# Patient Record
Sex: Female | Born: 1980 | Race: White | Hispanic: No | Marital: Married | Smoking: Never smoker
Health system: Southern US, Community
[De-identification: ages and names within clinical notes are randomized; demographics above are authoritative.]

## PROBLEM LIST (undated history)

## (undated) DIAGNOSIS — F32A Depression, unspecified: Secondary | ICD-10-CM

## (undated) DIAGNOSIS — Z3183 Encounter for assisted reproductive fertility procedure cycle: Secondary | ICD-10-CM

## (undated) DIAGNOSIS — G510 Bell's palsy: Secondary | ICD-10-CM

## (undated) DIAGNOSIS — N938 Other specified abnormal uterine and vaginal bleeding: Secondary | ICD-10-CM

## (undated) DIAGNOSIS — S93609A Unspecified sprain of unspecified foot, initial encounter: Secondary | ICD-10-CM

## (undated) DIAGNOSIS — R55 Syncope and collapse: Secondary | ICD-10-CM

## (undated) DIAGNOSIS — J309 Allergic rhinitis, unspecified: Secondary | ICD-10-CM

## (undated) DIAGNOSIS — F329 Major depressive disorder, single episode, unspecified: Secondary | ICD-10-CM

## (undated) DIAGNOSIS — G5600 Carpal tunnel syndrome, unspecified upper limb: Secondary | ICD-10-CM

## (undated) HISTORY — DX: Unspecified sprain of unspecified foot, initial encounter: S93.609A

## (undated) HISTORY — DX: Encounter for assisted reproductive fertility procedure cycle: Z31.83

## (undated) HISTORY — DX: Major depressive disorder, single episode, unspecified: F32.9

## (undated) HISTORY — DX: Allergic rhinitis, unspecified: J30.9

## (undated) HISTORY — DX: Bell's palsy: G51.0

## (undated) HISTORY — DX: Carpal tunnel syndrome, unspecified upper limb: G56.00

## (undated) HISTORY — DX: Syncope and collapse: R55

## (undated) HISTORY — DX: Depression, unspecified: F32.A

## (undated) HISTORY — DX: Other specified abnormal uterine and vaginal bleeding: N93.8

---

## 2009-05-16 HISTORY — PX: FACIAL RECONSTRUCTION SURGERY: SHX631

## 2009-08-04 ENCOUNTER — Emergency Department (HOSPITAL_BASED_OUTPATIENT_CLINIC_OR_DEPARTMENT_OTHER): Admission: EM | Admit: 2009-08-04 | Discharge: 2009-08-04 | Payer: Self-pay | Admitting: Emergency Medicine

## 2013-09-05 LAB — ROCKY MTN SPOTTED FVR AB, IGM-BLOOD: ROCKY MTN SPOTTED FEVER, IGM: 0.36

## 2014-01-29 LAB — LYME IGG/IGM AB
LYME IGM WB INTERP.: NEGATIVE
Lyme IgG/IgM Ab: <0.91

## 2014-01-29 LAB — URIC ACID: Uric Acid: 4.6

## 2014-01-29 LAB — RHEUMATOID FACTOR: Rheumatoid Factor Ab (IgM): <10

## 2014-01-29 LAB — ANTINUCLEAR ANTIBODIES, IFA: ANTINUCLEAR ANTIBODIES: NEGATIVE

## 2014-02-03 DIAGNOSIS — Z3183 Encounter for assisted reproductive fertility procedure cycle: Secondary | ICD-10-CM

## 2014-02-03 HISTORY — DX: Encounter for assisted reproductive fertility procedure cycle: Z31.83

## 2014-02-03 LAB — CBC AND DIFFERENTIAL
HCT: 40 % (ref 36–46)
HCT: 40 % (ref 36–46)
Hemoglobin: 13.1 g/dL (ref 12.0–16.0)
Hemoglobin: 13.1 g/dL (ref 12.0–16.0)
PLATELETS: 325 10*3/uL (ref 150–399)
Platelets: 325 10*3/uL (ref 150–399)
WBC: 8.7 10*3/mL
WBC: 8.7 10^3/mL

## 2014-02-03 LAB — SEDIMENTATION RATE: Sed Rate: 7

## 2014-03-06 DIAGNOSIS — J309 Allergic rhinitis, unspecified: Secondary | ICD-10-CM

## 2014-03-06 DIAGNOSIS — F419 Anxiety disorder, unspecified: Secondary | ICD-10-CM | POA: Insufficient documentation

## 2014-03-06 DIAGNOSIS — R5383 Other fatigue: Secondary | ICD-10-CM | POA: Insufficient documentation

## 2014-03-06 DIAGNOSIS — N938 Other specified abnormal uterine and vaginal bleeding: Secondary | ICD-10-CM

## 2014-03-06 DIAGNOSIS — M25579 Pain in unspecified ankle and joints of unspecified foot: Secondary | ICD-10-CM | POA: Insufficient documentation

## 2014-03-06 DIAGNOSIS — L858 Other specified epidermal thickening: Secondary | ICD-10-CM | POA: Insufficient documentation

## 2014-03-06 DIAGNOSIS — R251 Tremor, unspecified: Secondary | ICD-10-CM | POA: Insufficient documentation

## 2014-03-06 DIAGNOSIS — J329 Chronic sinusitis, unspecified: Secondary | ICD-10-CM | POA: Insufficient documentation

## 2014-03-06 DIAGNOSIS — F411 Generalized anxiety disorder: Secondary | ICD-10-CM | POA: Insufficient documentation

## 2014-03-06 DIAGNOSIS — F339 Major depressive disorder, recurrent, unspecified: Secondary | ICD-10-CM | POA: Insufficient documentation

## 2014-03-06 DIAGNOSIS — G510 Bell's palsy: Secondary | ICD-10-CM

## 2014-03-06 DIAGNOSIS — G5600 Carpal tunnel syndrome, unspecified upper limb: Secondary | ICD-10-CM

## 2014-03-06 DIAGNOSIS — D491 Neoplasm of unspecified behavior of respiratory system: Secondary | ICD-10-CM | POA: Insufficient documentation

## 2014-03-06 DIAGNOSIS — S93609A Unspecified sprain of unspecified foot, initial encounter: Secondary | ICD-10-CM

## 2014-03-06 HISTORY — DX: Unspecified sprain of unspecified foot, initial encounter: S93.609A

## 2014-03-06 HISTORY — DX: Carpal tunnel syndrome, unspecified upper limb: G56.00

## 2014-03-06 HISTORY — DX: Bell's palsy: G51.0

## 2014-03-06 HISTORY — DX: Allergic rhinitis, unspecified: J30.9

## 2014-03-06 HISTORY — DX: Other specified abnormal uterine and vaginal bleeding: N93.8

## 2014-12-22 DIAGNOSIS — R55 Syncope and collapse: Secondary | ICD-10-CM

## 2014-12-22 HISTORY — DX: Syncope and collapse: R55

## 2015-04-02 ENCOUNTER — Encounter: Payer: Self-pay | Admitting: Osteopathic Medicine

## 2015-04-02 ENCOUNTER — Ambulatory Visit (INDEPENDENT_AMBULATORY_CARE_PROVIDER_SITE_OTHER): Payer: Managed Care, Other (non HMO) | Admitting: Osteopathic Medicine

## 2015-04-02 VITALS — BP 125/67 | HR 69 | Ht 62.0 in | Wt 169.0 lb

## 2015-04-02 DIAGNOSIS — Z13220 Encounter for screening for lipoid disorders: Secondary | ICD-10-CM

## 2015-04-02 DIAGNOSIS — E669 Obesity, unspecified: Secondary | ICD-10-CM

## 2015-04-02 DIAGNOSIS — Z8249 Family history of ischemic heart disease and other diseases of the circulatory system: Secondary | ICD-10-CM | POA: Diagnosis not present

## 2015-04-02 DIAGNOSIS — N924 Excessive bleeding in the premenopausal period: Secondary | ICD-10-CM

## 2015-04-02 MED ORDER — PHENTERMINE HCL 15 MG PO CAPS: 15.0000 mg | ORAL_CAPSULE | ORAL | Status: DC

## 2015-04-02 MED ORDER — IBUPROFEN 800 MG PO TABS: 800.0000 mg | ORAL_TABLET | Freq: Three times a day (TID) | ORAL | Status: DC

## 2015-04-02 NOTE — Progress Notes (Signed)
HPI: Phyllis Glass is a 34 y.o. female who presents to [facility]  today for chief complaint of:  Chief Complaint  Patient presents with  . Establish Care    WEIGHT CONCERN   Patient here today with concern for obesity. She has been on phentermine in the past, she would like to restart this if possible since she is no longer breast-feeding. She has history of hypertension during pregnancies, never had to be on long-term medication for this. Reports last child was conceived via IVF, she has had her tubes tied in the past..  Also concerned for some heavy bleeding with her most recent period, bleeding has been irregular while breast-feeding, most recently was heavy for several days, was unable to leave the house was bleeding so much. No dizziness or loss of consciousness.   Past medical, social and family history reviewed: Past Medical History  Diagnosis Date  . 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 Allergic rhinitis 03/06/2014  . Bell palsy 03/06/2014  . Carpal tunnel syndrome 03/06/2014  . Foot sprain 03/06/2014  . DUB (dysfunctional uterine bleeding) 03/06/2014  . Encounter for assisted reproductive fertility procedure cycle 02/03/2014    Overview:  PRIOR IVF CYCLES:  0 PRIOR FET CYCLES:  0 PRIOR [place] CYCLES:  0  G 3 P  2 Term  2 Preterm  0 Biochemical  0 SAb  1 ECTOPIC  0   Packet Mailed: 1st packet given at NP appt 02/03/14; Protocol packet mailed 02/28/14  Diagnosis:  S/p BTL reversal 2013  Pharmacy:  [facility] ordered 02/28/14  Orientation Date:   02/17/14  Insurance Coverage: No  PAIRS:  offered  IVF Consents Signed: YES Date: 02/17/14  Deposit Paid: Yes  Date: 02/24/14  FEMALE TESTING  AMH: 3.52  Date: 02-03-14  Prolactin: 10.6 Date: 02-03-14  TSH: 3.46  Date: 02-03-14  IVF Labs: WNL   Date: 02-03-14  Group/Type: A+  Genetic Screening:WNL 02/03/14  PAP Smear:    Date:  Teaching  Appointment: Yes              Date: 04/08/14 @ 9  Uterine Cavity:  SIS WNL   Date:  06/2012  Mock ET and Measurements: yes Date:04/08/14  Prior ET:    Stylet Needed:  Dilation this cycle?  FEMALE TESTING  Semen Analysis: TMC: 57.8 Motility: 46 Date: 02-18-14  Female labs: WNL    Date: 02/03/14  PROTOCOL  Sperm Source:     Past Surgical History  Procedure Laterality Date  . Cesarean section      2005,2008,2016  . Facial reconstruction surgery  2011   Social History  Substance Use Topics  . Smoking status: Never Smoker   . Smokeless tobacco: Not on file  . Alcohol Use: Not on file   Family History  Problem Relation Age of Onset  . Hyperlipidemia Father   . Hypertension Father   . Alcohol abuse Maternal Grandfather   . Cancer Paternal Grandmother     BREAST  . Diabetes Paternal Grandmother   . Heart attack Father   . Heart attack Maternal Aunt   . Heart attack Maternal Uncle   . Heart attack Paternal Grandfather     No current outpatient prescriptions on file.   No current facility-administered medications for this visit.   No Known Allergies    Review of Systems: CONSTITUTIONAL:  No  fever, no chills, No  unintentional weight changes HEAD/EYES/EARS/NOSE/THROAT: No  headache, no vision change, no hearing change, No  sore throat CARDIAC: No chest pain, no pressure/palpitations, no orthopnea RESPIRATORY: No  cough, No  shortness of breath/wheeze GASTROINTESTINAL: No nausea, no vomiting, no abdominal pain, no blood in stool, no diarrhea, no constipation MUSCULOSKELETAL: No  myalgia/arthralgia GENITOURINARY: No incontinence, No abnormal genital bleeding/discharge SKIN: No rash/wounds/concerning lesions HEM/ONC: No easy bruising/bleeding, no abnormal lymph node ENDOCRINE: No polyuria/polydipsia/polyphagia, no heat/cold intolerance  NEUROLOGIC: No weakness, no dizziness, no slurred speech PSYCHIATRIC: No concerns with depression, no concerns with anxiety, no sleep problems     Exam:  BP 125/67 mmHg  Pulse 69  Ht 5\' 2"  (1.575 m)  Wt 169 lb (76.658 kg)  BMI 30.90 kg/m2 Constitutional: VSS, see above. General Appearance: alert, well-developed, well-nourished, NAD Eyes: Normal lids and conjunctive, non-icteric sclera, very slight facial droop on left side, consistent with history of persistent Bell's palsy Ears, Nose, Mouth, Throat: Normal external inspection ears/nares/mouth/lips/gums, MMM,  Neck: No masses, trachea midline. No thyroid enlargement/tenderness/mass appreciated. No lymphadenopathy Respiratory: Normal respiratory effort. no wheeze, no rhonchi, no rales Cardiovascular: S1/S2 normal, no murmur, no rub/gallop auscultated. RRR.  Gastrointestinal: Nontender, no masses. No hepatomegaly, no splenomegaly. No hernia appreciated. Bowel sounds normal. Rectal exam deferred.  Musculoskeletal: Gait normal. No clubbing/cyanosis of digits.  Neurological: No cranial nerve deficit on limited exam. Motor and sensation intact and symmetric Psychiatric: Normal judgment/insight. Normal mood and affect. Oriented x3.    No results found for this or any previous visit (from the past 72 hour(s)).    ASSESSMENT/PLAN:  Obesity - Plan: phentermine 15 MG capsule  Excessive bleeding in premenopausal period - Plan: ibuprofen (ADVIL,MOTRIN) 800 MG tablet  Lipid screening - Plan: COMPLETE METABOLIC PANEL WITH GFR, Lipid panel  Family history of heart attack - Plan: COMPLETE METABOLIC PANEL WITH GFR, Lipid panel   We'll start low-dose phentermine and follow blood pressure closely given history of PIH. Patient advised try ibuprofen for heavy bleeding, if bleeding continues to be an issue can consider hormonal contraception, however most likely irregularities are due to hormonal changes after discontinuing breast-feeding. Patient also requests cholesterol screening given family history of heart attacks at young age  Return in about 4 weeks (around 04/30/2015) for WEIGHT AND  BP CHECK ON PHENTERMINE.

## 2015-04-07 ENCOUNTER — Ambulatory Visit: Payer: Self-pay | Admitting: Osteopathic Medicine

## 2015-04-23 ENCOUNTER — Ambulatory Visit (INDEPENDENT_AMBULATORY_CARE_PROVIDER_SITE_OTHER): Payer: Managed Care, Other (non HMO) | Admitting: Osteopathic Medicine

## 2015-04-23 ENCOUNTER — Encounter: Payer: Self-pay | Admitting: Osteopathic Medicine

## 2015-04-23 VITALS — BP 123/78 | HR 68 | Ht 62.0 in | Wt 167.0 lb

## 2015-04-23 DIAGNOSIS — E669 Obesity, unspecified: Secondary | ICD-10-CM

## 2015-04-23 MED ORDER — PHENTERMINE HCL 30 MG PO CAPS: 30.0000 mg | ORAL_CAPSULE | ORAL | Status: DC

## 2015-04-23 NOTE — Progress Notes (Signed)
HPI: Phyllis Glass is a 34 y.o. female who presents to [facility] today for chief complaint of:  Chief Complaint  Patient presents with  . Follow-up    weight     Obesity Medication Management: Patient has been taking Phentermine 15 mg daily and has been unsuccessfully losing weight. Patient denies bothersome side effects of medication.   Blood pressure: see vital signs below. Patient denies chest pain, pressure, palpitations. No BP increase on meds.    Past medical, social and family history reviewed: Past Medical History  Diagnosis Date  . 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 Allergic rhinitis 03/06/2014  . Bell palsy 03/06/2014  . Carpal tunnel syndrome 03/06/2014  . Foot sprain 03/06/2014  . DUB (dysfunctional uterine bleeding) 03/06/2014  . Encounter for assisted reproductive fertility procedure cycle 02/03/2014    Overview:  PRIOR IVF CYCLES:  0 PRIOR FET CYCLES:  0 PRIOR [place] CYCLES:  0  G 3 P  2 Term  2 Preterm  0 Biochemical  0 SAb  1 ECTOPIC  0   Packet Mailed: 1st packet given at NP appt 02/03/14; Protocol packet mailed 02/28/14  Diagnosis:  S/p BTL reversal 2013  Pharmacy:  [facility] ordered 02/28/14  Orientation Date:   02/17/14  Insurance Coverage: No  PAIRS:  offered  IVF Consents Signed: YES Date: 02/17/14  Deposit Paid: Yes  Date: 02/24/14  FEMALE TESTING  AMH: 3.52  Date: 02-03-14  Prolactin: 10.6 Date: 02-03-14  TSH: 3.46  Date: 02-03-14  IVF Labs: WNL   Date: 02-03-14  Group/Type: A+  Genetic Screening:WNL 02/03/14  PAP Smear:    Date:  Teaching Appointment: Yes              Date: 04/08/14 @ 9  Uterine Cavity:  SIS WNL   Date:  06/2012  Mock ET and Measurements: yes Date:04/08/14  Prior ET:    Stylet Needed:  Dilation this cycle?  FEMALE TESTING  Semen Analysis: TMC: 57.8 Motility: 46 Date: 02-18-14  Female labs: WNL    Date: 02/03/14  PROTOCOL  Sperm Source:       Current Outpatient Prescriptions  Medication Sig Dispense Refill  . Glucosamine-Chondroit-Vit C-Mn (GLUCOSAMINE CHONDR 1500 COMPLX PO) Take 1,500 mg by mouth daily. TAKE 2 CAPSULES A DAY PER PATIENT    . ibuprofen (ADVIL,MOTRIN) 800 MG tablet Take 1 tablet (800 mg total) by mouth 3 (three) times daily. Start prior to period and continue for 5 days 30 tablet 1  . phentermine 15 MG capsule Take 1 capsule (30 mg total) by mouth every morning. 30 capsule 0   No current facility-administered medications for this visit.   No Known Allergies   Review of Systems: CONSTITUTIONAL:  No  intentional weight changes CARDIAC: No  chest pain, No  pressure, No palpitations, No  orthopnea RESPIRATORY: No  cough, No  shortness of breath/wheeze GASTROINTESTINAL: No  nausea, No  vomiting, No  abdominal pain, NEUROLOGIC: No  weakness, No  dizziness  Exam:  BP 123/78 mmHg  Pulse 68  Ht 5\' 2"  (1.575 m)  Wt 167 lb (75.751 kg)  BMI 30.54 kg/m2  2 lb weight loss since last visit Constitutional: VS see above. General Appearance: alert, well-developed, well-nourished, NAD Respiratory: Normal respiratory effort. no wheeze, no rhonchi, no rales Cardiovascular: S1/S2 normal, no murmur, no rub/gallop auscultated. RRR.  Gastrointestinal: Nontender, no masses. No hepatomegaly, no splenomegaly. No hernia appreciated. Bowel sounds  normal. Rectal exam deferred.  Psychiatric: Normal judgment/insight. Normal mood and affect. Oriented x3.   No results found for this or any previous visit (from the past 72 hour(s)).    ASSESSMENT/PLAN: Increase to Phentermine 30mg  daily and recheck 1 month.   Obesity - Plan: phentermine 30 MG capsule  Return in about 4 weeks (around 05/21/2015), or sooner if any concerns, for MEDICATION MANAGEMENT. Marland Kitchen

## 2015-04-24 LAB — COMPLETE METABOLIC PANEL WITH GFR
ALBUMIN: 4.2 g/dL (ref 3.6–5.1)
ALK PHOS: 44 U/L (ref 33–115)
ALT: 17 U/L (ref 6–29)
AST: 19 U/L (ref 10–30)
BILIRUBIN TOTAL: 0.3 mg/dL (ref 0.2–1.2)
BUN: 12 mg/dL (ref 7–25)
CO2: 28 mmol/L (ref 20–31)
CREATININE: 0.78 mg/dL (ref 0.50–1.10)
Calcium: 9.1 mg/dL (ref 8.6–10.2)
Chloride: 103 mmol/L (ref 98–110)
GFR, Est African American: >89 mL/min (ref >=60)
GLUCOSE: 91 mg/dL (ref 65–99)
Potassium: 4.6 mmol/L (ref 3.5–5.3)
SODIUM: 140 mmol/L (ref 135–146)
TOTAL PROTEIN: 6.8 g/dL (ref 6.1–8.1)

## 2015-04-24 LAB — LIPID PANEL
Cholesterol: 189 mg/dL (ref 125–200)
HDL: 49 mg/dL (ref >=46)
LDL CALC: 114 mg/dL (ref <130)
Total CHOL/HDL Ratio: 3.9 Ratio (ref <=5.0)
Triglycerides: 131 mg/dL (ref <150)
VLDL: 26 mg/dL (ref <30)

## 2015-05-22 ENCOUNTER — Ambulatory Visit: Payer: Managed Care, Other (non HMO) | Admitting: Osteopathic Medicine

## 2015-06-05 ENCOUNTER — Encounter: Payer: Self-pay | Admitting: Osteopathic Medicine

## 2015-06-09 ENCOUNTER — Encounter: Payer: Self-pay | Admitting: Osteopathic Medicine

## 2015-06-16 ENCOUNTER — Emergency Department (HOSPITAL_BASED_OUTPATIENT_CLINIC_OR_DEPARTMENT_OTHER)
Admission: EM | Admit: 2015-06-16 | Discharge: 2015-06-16 | Disposition: A | Discharge status: Home / Self Care | Payer: Managed Care, Other (non HMO) | Attending: Emergency Medicine | Admitting: Emergency Medicine

## 2015-06-16 ENCOUNTER — Encounter (HOSPITAL_BASED_OUTPATIENT_CLINIC_OR_DEPARTMENT_OTHER): Payer: Self-pay | Admitting: Emergency Medicine

## 2015-06-16 DIAGNOSIS — Z87828 Personal history of other (healed) physical injury and trauma: Secondary | ICD-10-CM | POA: Insufficient documentation

## 2015-06-16 DIAGNOSIS — T505X5A Adverse effect of appetite depressants, initial encounter: Secondary | ICD-10-CM | POA: Diagnosis not present

## 2015-06-16 DIAGNOSIS — Z8669 Personal history of other diseases of the nervous system and sense organs: Secondary | ICD-10-CM | POA: Insufficient documentation

## 2015-06-16 DIAGNOSIS — Z8742 Personal history of other diseases of the female genital tract: Secondary | ICD-10-CM | POA: Insufficient documentation

## 2015-06-16 DIAGNOSIS — R079 Chest pain, unspecified: Secondary | ICD-10-CM | POA: Diagnosis present

## 2015-06-16 DIAGNOSIS — R21 Rash and other nonspecific skin eruption: Secondary | ICD-10-CM | POA: Diagnosis not present

## 2015-06-16 DIAGNOSIS — R42 Dizziness and giddiness: Secondary | ICD-10-CM | POA: Insufficient documentation

## 2015-06-16 DIAGNOSIS — T50905A Adverse effect of unspecified drugs, medicaments and biological substances, initial encounter: Secondary | ICD-10-CM

## 2015-06-16 DIAGNOSIS — R531 Weakness: Secondary | ICD-10-CM | POA: Diagnosis not present

## 2015-06-16 DIAGNOSIS — Z79899 Other long term (current) drug therapy: Secondary | ICD-10-CM | POA: Diagnosis not present

## 2015-06-16 DIAGNOSIS — R0602 Shortness of breath: Secondary | ICD-10-CM | POA: Insufficient documentation

## 2015-06-16 LAB — COMPREHENSIVE METABOLIC PANEL
ALT: 16 U/L (ref 14–54)
AST: 22 U/L (ref 15–41)
Albumin: 4.5 g/dL (ref 3.5–5.0)
Alkaline Phosphatase: 50 U/L (ref 38–126)
Anion gap: 10 (ref 5–15)
BILIRUBIN TOTAL: 0.4 mg/dL (ref 0.3–1.2)
BUN: 10 mg/dL (ref 6–20)
CALCIUM: 9.3 mg/dL (ref 8.9–10.3)
CO2: 25 mmol/L (ref 22–32)
CREATININE: 0.71 mg/dL (ref 0.44–1.00)
Chloride: 104 mmol/L (ref 101–111)
GFR calc Af Amer: >60 mL/min (ref >60)
Glucose, Bld: 122 mg/dL — ABNORMAL HIGH (ref 65–99)
POTASSIUM: 3.7 mmol/L (ref 3.5–5.1)
Sodium: 139 mmol/L (ref 135–145)
TOTAL PROTEIN: 7.7 g/dL (ref 6.5–8.1)

## 2015-06-16 LAB — CBC WITH DIFFERENTIAL/PLATELET
BASOS PCT: 0 %
Basophils Absolute: 0 10*3/uL (ref 0.0–0.1)
EOS ABS: 0.2 10*3/uL (ref 0.0–0.7)
EOS PCT: 2 %
HCT: 41.6 % (ref 36.0–46.0)
HEMOGLOBIN: 13.5 g/dL (ref 12.0–15.0)
Lymphocytes Relative: 34 %
Lymphs Abs: 2.3 10*3/uL (ref 0.7–4.0)
MCH: 27.4 pg (ref 26.0–34.0)
MCHC: 32.5 g/dL (ref 30.0–36.0)
MCV: 84.4 fL (ref 78.0–100.0)
MONOS PCT: 8 %
Monocytes Absolute: 0.5 10*3/uL (ref 0.1–1.0)
NEUTROS PCT: 56 %
Neutro Abs: 3.8 10*3/uL (ref 1.7–7.7)
PLATELETS: 222 10*3/uL (ref 150–400)
RBC: 4.93 MIL/uL (ref 3.87–5.11)
RDW: 13.2 % (ref 11.5–15.5)
WBC: 6.8 10*3/uL (ref 4.0–10.5)

## 2015-06-16 LAB — TROPONIN I

## 2015-06-16 MED ORDER — DIPHENHYDRAMINE HCL 25 MG PO CAPS
25.0000 mg | ORAL_CAPSULE | Freq: Once | ORAL | Status: AC
  Administered 2015-06-16: 25 mg via ORAL
  Filled 2015-06-16: qty 1

## 2015-06-16 NOTE — ED Notes (Signed)
Pt in c/o chest pain and SOB onset today associated with weakness and dizziness and metallic taste in mouth. Started a new medicine last Thursday. EKG unremarkable.

## 2015-06-16 NOTE — ED Provider Notes (Signed)
CSN: [id number]     Arrival date & time 06/16/15  1956 History  By signing my name below, I, Hansel Feinstein, attest that this documentation has been prepared under the direction and in the presence of Veryl Speak, MD. Electronically Signed: Hansel Feinstein, ED Scribe. 06/16/2015. 9:07 PM.    Chief Complaint  Patient presents with  . Chest Pain   Patient is a 35 y.o. female presenting with chest pain. The history is provided by the patient. No language interpreter was used.  Chest Pain Pain location:  Substernal area Pain quality: aching   Pain radiates to:  Does not radiate Pain radiates to the back: no   Pain severity:  Moderate Onset quality:  Gradual Duration:  1 day Timing:  Constant Progression:  Unchanged Chronicity:  New Context: at rest   Relieved by:  Aspirin Worsened by:  Nothing tried Ineffective treatments:  None tried Associated symptoms: dizziness, shortness of breath and weakness   Associated symptoms: no dysphagia   Risk factors: no diabetes mellitus, no high cholesterol, no hypertension and no smoking     HPI Comments: Phyllis Glass is a 35 y.o. female neurocardiogenic syncope, bells palsy who presents to the Emergency Department complaining of moderate substernal CP with associated SOB, generalized weakness, dizziness, metallic taste. She also complains of intermittent generalized rashes for 5 days. She states she took ASA PTA with some relief of CP. Per pt, she has been taking Benadryl this week for the rash with some relief. Pt states she started phentermine 5 days ago (last dose this morning). Per pt, she was prescribed the medication by her PCP for weight loss. Per pt, she took this medication several years ago for 3 months without any side effects. She notes she was seen by her PCP today and her BP was 160/90 with a normal EKG. FHx of MI<55 yo. No h/o MI, DM, HTN, HLD. Pt is a non-smoker. Pt states she is active daily without difficulty, CP or SOB. Per pt, she had some  coffee today, but does not generally consume caffeine. She denies additional symptoms, difficulty swallowing.   Past Medical History  Diagnosis Date  . 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 Allergic rhinitis 03/06/2014  . Bell palsy 03/06/2014  . Carpal tunnel syndrome 03/06/2014  . Foot sprain 03/06/2014  . DUB (dysfunctional uterine bleeding) 03/06/2014  . Encounter for assisted reproductive fertility procedure cycle 02/03/2014    Overview:  PRIOR IVF CYCLES:  0 PRIOR FET CYCLES:  0 PRIOR [place] CYCLES:  0  G 3 P  2 Term  2 Preterm  0 Biochemical  0 SAb  1 ECTOPIC  0   Packet Mailed: 1st packet given at NP appt 02/03/14; Protocol packet mailed 02/28/14  Diagnosis:  S/p BTL reversal 2013  Pharmacy:  [facility] ordered 02/28/14  Orientation Date:   02/17/14  Insurance Coverage: No  PAIRS:  offered  IVF Consents Signed: YES Date: 02/17/14  Deposit Paid: Yes  Date: 02/24/14  FEMALE TESTING  AMH: 3.52  Date: 02-03-14  Prolactin: 10.6 Date: 02-03-14  TSH: 3.46  Date: 02-03-14  IVF Labs: WNL   Date: 02-03-14  Group/Type: A+  Genetic Screening:WNL 02/03/14  PAP Smear:    Date:  Teaching Appointment: Yes              Date: 04/08/14 @ 9  Uterine Cavity:  SIS WNL   Date:  06/2012  Mock ET and Measurements: yes  Date:04/08/14  Prior ET:    Stylet Needed:  Dilation this cycle?  FEMALE TESTING  Semen Analysis: TMC: 57.8 Motility: 46 Date: 02-18-14  Female labs: WNL    Date: 02/03/14  PROTOCOL  Sperm Source:     Past Surgical History  Procedure Laterality Date  . Cesarean section      2005,2008,2016  . Facial reconstruction surgery  2011    For attempted nerve damage repair Bell's palsy   Family History  Problem Relation Age of Onset  . Hyperlipidemia Father   . Hypertension Father   . Alcohol abuse Maternal Grandfather   . Cancer Paternal Grandmother     BREAST  . Diabetes Paternal Grandmother   . Heart attack Father   . Heart attack  Maternal Aunt   . Heart attack Maternal Uncle   . Heart attack Paternal Grandfather    Social History  Substance Use Topics  . Smoking status: Never Smoker   . Smokeless tobacco: None  . Alcohol Use: None   OB History    No data available     Review of Systems  HENT: Negative for trouble swallowing.   Respiratory: Positive for shortness of breath.   Cardiovascular: Positive for chest pain.  Skin: Positive for rash.  Neurological: Positive for dizziness and weakness.  All other systems reviewed and are negative.  Allergies  Review of patient's allergies indicates no known allergies.  Home Medications   Prior to Admission medications   Medication Sig Start Date End Date Taking? Authorizing Provider  Glucosamine-Chondroit-Vit C-Mn (GLUCOSAMINE CHONDR 1500 COMPLX PO) Take 1,500 mg by mouth daily. TAKE 2 CAPSULES A DAY PER PATIENT    Historical Provider, MD  ibuprofen (ADVIL,MOTRIN) 800 MG tablet Take 1 tablet (800 mg total) by mouth 3 (three) times daily. Start prior to period and continue for 5 days 04/02/15   Emeterio Reeve, DO  phentermine 30 MG capsule Take 1 capsule (30 mg total) by mouth every morning. 04/23/15   Natalie Alexander, DO   BP 138/95 mmHg  Pulse 75  Temp(Src) 97.8 F (36.6 C) (Oral)  Resp 16  Ht 5\' 2"  (1.575 m)  Wt 160 lb (72.576 kg)  BMI 29.26 kg/m2  SpO2 100%  LMP 05/16/2015 Physical Exam  Constitutional: She is oriented to person, place, and time. She appears well-developed and well-nourished. No distress.  HENT:  Head: Normocephalic and atraumatic.  Mouth/Throat: Oropharynx is clear and moist. No uvula swelling. No oropharyngeal exudate.  Eyes: Conjunctivae and EOM are normal. Pupils are equal, round, and reactive to light.  Neck: Normal range of motion.  Cardiovascular: Normal rate, regular rhythm and normal heart sounds.  Exam reveals no gallop and no friction rub.   No murmur heard. Pulmonary/Chest: Effort normal and breath sounds normal. No  respiratory distress. She has no wheezes. She has no rales.  Abdominal: Soft. She exhibits no distension and no mass. There is no tenderness. There is no rebound and no guarding.  Musculoskeletal: Normal range of motion.  Lymphadenopathy:    She has no cervical adenopathy.  Neurological: She is alert and oriented to person, place, and time.  Skin: Skin is warm and dry. Rash noted.  There is an urticarial-like rash to the chest and upper back.   Psychiatric: She has a normal mood and affect. Judgment normal.  Nursing note and vitals reviewed.   ED Course  Procedures (including critical care time) DIAGNOSTIC STUDIES: Oxygen Saturation is 100% on RA, normal by my interpretation.    COORDINATION  OF CARE: 9:03 PM Discussed treatment plan with pt at bedside and pt agreed to plan.   Labs Review Labs Reviewed  CBC WITH DIFFERENTIAL/PLATELET  COMPREHENSIVE METABOLIC PANEL  TROPONIN I    Imaging Review No results found. I have personally reviewed and evaluated these images and lab results as part of my medical decision-making.   EKG Interpretation   Date/Time:  Tuesday June 16 2015 20:04:36 EST Ventricular Rate:  78 PR Interval:  164 QRS Duration: 90 QT Interval:  360 QTC Calculation: 410 R Axis:   57 Text Interpretation:  Normal sinus rhythm Normal ECG Confirmed by Darshawn Boateng   MD, Percy Winterrowd ([id number]) on 06/16/2015 10:07:10 PM      MDM   Final diagnoses:  None    Workup reveals a normal EKG and negative troponin. Laboratory studies are unremarkable. Patient did have a blotchy rash to her chest and back which improved with Benadryl. I suspect the symptoms and rash are related to a reaction to this phentermine medication she is taking for weight loss. It is my advice that she stops taking this and talks to her doctor about alternate medications.  I personally performed the services described in this documentation, which was scribed in my presence. The recorded information has been  reviewed and is accurate.       Veryl Speak, MD 06/16/15 [phone]

## 2015-06-16 NOTE — Discharge Instructions (Signed)
Stop taking your phentermine until you speak with your primary physician.  Return to the ER if symptoms significantly worsen or change.

## 2015-07-16 ENCOUNTER — Telehealth: Payer: Self-pay | Admitting: Osteopathic Medicine

## 2015-07-16 ENCOUNTER — Other Ambulatory Visit: Payer: Self-pay | Admitting: Osteopathic Medicine

## 2015-07-16 NOTE — Telephone Encounter (Signed)
I called and spoke to patient to schedule a follow up appointment she stated she did not have her calendar available at that time and she would call back to schedule. - CF

## 2015-09-30 ENCOUNTER — Ambulatory Visit (INDEPENDENT_AMBULATORY_CARE_PROVIDER_SITE_OTHER): Payer: Managed Care, Other (non HMO) | Admitting: Osteopathic Medicine

## 2015-09-30 ENCOUNTER — Encounter: Payer: Self-pay | Admitting: Osteopathic Medicine

## 2015-09-30 VITALS — BP 129/81 | HR 96 | Ht 62.0 in | Wt 176.0 lb

## 2015-09-30 DIAGNOSIS — F32A Depression, unspecified: Secondary | ICD-10-CM

## 2015-09-30 DIAGNOSIS — F329 Major depressive disorder, single episode, unspecified: Secondary | ICD-10-CM | POA: Diagnosis not present

## 2015-09-30 MED ORDER — FLUOXETINE HCL (PMDD) 20 MG PO CAPS: 20.0000 mg | ORAL_CAPSULE | Freq: Every day | ORAL | Status: DC

## 2015-09-30 MED ORDER — FLUOXETINE HCL 40 MG PO CAPS: 40.0000 mg | ORAL_CAPSULE | Freq: Every day | ORAL | Status: DC

## 2015-09-30 NOTE — Progress Notes (Signed)
HPI: Phyllis Glass is a 35 y.o. female who presents to [facility] today for chief complaint of:  Chief Complaint  Patient presents with  . Follow-up    PROZAC    New problem to address today - DEPRESSION . Duration: on and off many years . Context: 3 years on Prozac after last pregnancy - was taking 40 mg and was doing well on this.  . Modifying factors: Had previously been on other antidepressants which didn't work very well. Zoloft, Lexapro. . Assoc signs/symptoms: weight gain, trouble controlling eating habits when depressed, no SI/HI   Past medical, social and family history reviewed: Past Medical History  Diagnosis Date  . 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 Allergic rhinitis 03/06/2014  . Bell palsy 03/06/2014  . Carpal tunnel syndrome 03/06/2014  . Foot sprain 03/06/2014  . DUB (dysfunctional uterine bleeding) 03/06/2014  . Encounter for assisted reproductive fertility procedure cycle 02/03/2014    Overview:  PRIOR IVF CYCLES:  0 PRIOR FET CYCLES:  0 PRIOR [place] CYCLES:  0  G 3 P  2 Term  2 Preterm  0 Biochemical  0 SAb  1 ECTOPIC  0   Packet Mailed: 1st packet given at NP appt 02/03/14; Protocol packet mailed 02/28/14  Diagnosis:  S/p BTL reversal 2013  Pharmacy:  [facility] ordered 02/28/14  Orientation Date:   02/17/14  Insurance Coverage: No  PAIRS:  offered  IVF Consents Signed: YES Date: 02/17/14  Deposit Paid: Yes  Date: 02/24/14  FEMALE TESTING  AMH: 3.52  Date: 02-03-14  Prolactin: 10.6 Date: 02-03-14  TSH: 3.46  Date: 02-03-14  IVF Labs: WNL   Date: 02-03-14  Group/Type: A+  Genetic Screening:WNL 02/03/14  PAP Smear:    Date:  Teaching Appointment: Yes              Date: 04/08/14 @ 9  Uterine Cavity:  SIS WNL   Date:  06/2012  Mock ET and Measurements: yes Date:04/08/14  Prior ET:    Stylet Needed:  Dilation this cycle?  FEMALE TESTING  Semen Analysis: TMC: 57.8  Motility: 46 Date: 02-18-14  Female labs: WNL    Date: 02/03/14  PROTOCOL  Sperm Source:     Past Surgical History  Procedure Laterality Date  . Cesarean section      2005,2008,2016  . Facial reconstruction surgery  2011    For attempted nerve damage repair Bell's palsy   Social History  Substance Use Topics  . Smoking status: Never Smoker   . Smokeless tobacco: Not on file  . Alcohol Use: Not on file   Family History  Problem Relation Age of Onset  . Hyperlipidemia Father   . Hypertension Father   . Alcohol abuse Maternal Grandfather   . Cancer Paternal Grandmother     BREAST  . Diabetes Paternal Grandmother   . Heart attack Father   . Heart attack Maternal Aunt   . Heart attack Maternal Uncle   . Heart attack Paternal Grandfather     Current Outpatient Prescriptions  Medication Sig Dispense Refill  . Glucosamine-Chondroit-Vit C-Mn (GLUCOSAMINE CHONDR 1500 COMPLX PO) Take 1,500 mg by mouth daily. TAKE 2 CAPSULES A DAY PER PATIENT     No current facility-administered medications for this visit.   No Known Allergies    Review of Systems: CONSTITUTIONAL:  No  fever, no chills, (+) weight gain unintentional weight changes HEAD/EYES/EARS/NOSE/THROAT: No  headache, no  vision change CARDIAC: No  chest pain PSYCHIATRIC: (+) concerns with depression, No  concerns with anxiety, No sleep problems  Exam:  BP 129/81 mmHg  Pulse 96  Ht 5\' 2"  (1.575 m)  Wt 176 lb (79.833 kg)  BMI 32.18 kg/m2 Constitutional: VS see above. General Appearance: alert, well-developed, well-nourished, NAD Eyes: Normal lids and conjunctive, non-icteric sclera,  Ears, Nose, Mouth, Throat: MMM, Normal external inspection ears/nares/mouth/lips/gums,  Respiratory: Normal respiratory effort.  Musculoskeletal: Gait normal.  Psychiatric: Normal judgment/insight. Normal mood and affect. Oriented x3. No thought disorder, no SI/HI   No results found for this or any previous visit (from the past 36  hour(s)).    ASSESSMENT/PLAN:  Depression - Plan: FLUoxetine (PROZAC) 40 MG capsule, Fluoxetine HCl, PMDD, 20 MG CAPS   20 mg x 2 weeks then increase to 40 mg. I don't See 40 mg tablets listed on Epic as a choice for prescription, so to prescriptions with appropriate start/stop dates were written for the patient, she is encouraged to ask pharmacist if comes and 40 mg tablets which he can cut, I'm happy to do this for her. Follow-up in 6 months, sooner if needed or if any concerns.  All questions were answered. Visit summary with updated medication list and pertinent instructions was printed for patient. ER/RTC precautions were reviewed with the patient. Return in about 6 months (around 04/01/2016), or sooner if needed, for MEDICATION MANAGEMENT.

## 2015-09-30 NOTE — Patient Instructions (Signed)
Prozac - 20 mg x 2 weeks then up to 40 mg

## 2016-02-10 ENCOUNTER — Other Ambulatory Visit: Payer: Self-pay | Admitting: Osteopathic Medicine

## 2016-02-10 DIAGNOSIS — F329 Major depressive disorder, single episode, unspecified: Secondary | ICD-10-CM

## 2016-02-10 DIAGNOSIS — F32A Depression, unspecified: Secondary | ICD-10-CM

## 2016-03-14 ENCOUNTER — Other Ambulatory Visit: Payer: Self-pay | Admitting: Physician Assistant

## 2016-03-14 DIAGNOSIS — F329 Major depressive disorder, single episode, unspecified: Secondary | ICD-10-CM

## 2016-03-14 DIAGNOSIS — F32A Depression, unspecified: Secondary | ICD-10-CM

## 2016-05-26 ENCOUNTER — Other Ambulatory Visit: Payer: Self-pay | Admitting: Osteopathic Medicine

## 2016-05-26 DIAGNOSIS — F329 Major depressive disorder, single episode, unspecified: Secondary | ICD-10-CM

## 2016-05-26 DIAGNOSIS — F32A Depression, unspecified: Secondary | ICD-10-CM

## 2016-07-02 ENCOUNTER — Other Ambulatory Visit: Payer: Self-pay | Admitting: Osteopathic Medicine

## 2016-07-02 DIAGNOSIS — F329 Major depressive disorder, single episode, unspecified: Secondary | ICD-10-CM

## 2016-07-02 DIAGNOSIS — F32A Depression, unspecified: Secondary | ICD-10-CM

## 2016-07-11 ENCOUNTER — Other Ambulatory Visit: Payer: Self-pay

## 2016-07-11 DIAGNOSIS — F3289 Other specified depressive episodes: Secondary | ICD-10-CM

## 2016-07-11 MED ORDER — FLUOXETINE HCL 40 MG PO CAPS: 40.0000 mg | ORAL_CAPSULE | Freq: Every day | ORAL | 0 refills | Status: DC

## 2016-07-11 NOTE — Telephone Encounter (Signed)
Patient called requested a refill for Floxetine 40 mg. Patient was advised on last refill that she needed a follow up appointment and she did not schedule before she ran out.  I refilled RX 1 MORE TIME FOR PATIENT BECAUSE SHE STATED THAT SHE DIDN'T WANT TO COME IN THE OFFICE DURING FLU SEASON BECAUSE SHE DID NOT WANT TO CATCH ANY GERMS AND TAKE HOME TOO HER 5 KIDS! PATIENT VERBALLY UNDERSTOOD THAT SHE MUST SCHEDULE A FOLLOW UP BEFORE BEFORE SHE RUNS OUT OR HER REFILL REQUEST WILL BE DENIED. PLEASE ADVISE. Rhonda Cunningham,CMA

## 2016-08-11 ENCOUNTER — Encounter: Payer: Self-pay | Admitting: Osteopathic Medicine

## 2016-08-11 ENCOUNTER — Ambulatory Visit (INDEPENDENT_AMBULATORY_CARE_PROVIDER_SITE_OTHER): Payer: Managed Care, Other (non HMO) | Admitting: Osteopathic Medicine

## 2016-08-11 DIAGNOSIS — F3341 Major depressive disorder, recurrent, in partial remission: Secondary | ICD-10-CM | POA: Diagnosis not present

## 2016-08-11 DIAGNOSIS — F3289 Other specified depressive episodes: Secondary | ICD-10-CM | POA: Diagnosis not present

## 2016-08-11 MED ORDER — FLUOXETINE HCL 40 MG PO CAPS: 40.0000 mg | ORAL_CAPSULE | Freq: Every day | ORAL | 3 refills | Status: DC

## 2016-08-11 NOTE — Progress Notes (Signed)
HPI: Phyllis Glass is a 36 y.o. female who presents to [place] today for chief complaint of:  Chief Complaint  Patient presents with  . Follow-up    DEPRESSION    Follow up on DEPRESSION - last seen on 09/30/15 . Duration: on and off many years . Context: 3 years on Prozac after last pregnancy - was taking 40 mg and was doing well on this. Prozac started at last visit with instructions to follow-up 03/2016 . Note: Had previously been on other antidepressants which didn't work very well. Zoloft, Lexapro, Effexor. . Depression-assoc signs/symptoms at last viist: weight gain, trouble controlling eating habits when depressed, no SI/HI. Current status: weight gain since last visit. Moods are much improved.     Past medical, social and family history reviewed: Past Medical History:  Diagnosis Date  . Allergic rhinitis 03/06/2014  . Bell palsy 03/06/2014  . Carpal tunnel syndrome 03/06/2014  . DUB (dysfunctional uterine bleeding) 03/06/2014  . Encounter for assisted reproductive fertility procedure cycle 02/03/2014   Overview:  PRIOR IVF CYCLES:  0 PRIOR FET CYCLES:  0 PRIOR [place] CYCLES:  0  G 3 P  2 Term  2 Preterm  0 Biochemical  0 SAb  1 ECTOPIC  0   Packet Mailed: 1st packet given at NP appt 02/03/14; Protocol packet mailed 02/28/14  Diagnosis:  S/p BTL reversal 2013  Pharmacy:  [facility] ordered 02/28/14  Orientation Date:   02/17/14  Insurance Coverage: No  PAIRS:  offered  IVF Consents Signed: YES Date: 02/17/14  Deposit Paid: Yes  Date: 02/24/14  FEMALE TESTING  AMH: 3.52  Date: 02-03-14  Prolactin: 10.6 Date: 02-03-14  TSH: 3.46  Date: 02-03-14  IVF Labs: WNL   Date: 02-03-14  Group/Type: A+  Genetic Screening:WNL 02/03/14  PAP Smear:    Date:  Teaching Appointment: Yes              Date: 04/08/14 @ 9  Uterine Cavity:  SIS WNL   Date:  06/2012  Mock ET and Measurements: yes Date:04/08/14  Prior ET:    Stylet Needed:  Dilation this  cycle?  FEMALE TESTING  Semen Analysis: TMC: 57.8 Motility: 46 Date: 02-18-14  Female labs: WNL    Date: 02/03/14  PROTOCOL  Sperm Source:    . Foot sprain 03/06/2014  . Neurocardiogenic syncope 12/22/2014   Overview:  DURING MEDICAL PROCEDURES PER PATIENT REPORT    Past Surgical History:  Procedure Laterality Date  . CESAREAN SECTION     [phone]  . FACIAL RECONSTRUCTION SURGERY  2011   For attempted nerve damage repair Bell's palsy   Social History  Substance Use Topics  . Smoking status: Never Smoker  . Smokeless tobacco: Not on file  . Alcohol use Not on file   Family History  Problem Relation Age of Onset  . Hyperlipidemia Father   . Hypertension Father   . Alcohol abuse Maternal Grandfather   . Cancer Paternal Grandmother     BREAST  . Diabetes Paternal Grandmother   . Heart attack Father   . Heart attack Maternal Aunt   . Heart attack Maternal Uncle   . Heart attack Paternal Grandfather     Current Outpatient Prescriptions  Medication Sig Dispense Refill  . FLUoxetine (PROZAC) 40 MG capsule Take 1 capsule (40 mg total) by mouth daily. NEED FOLLOW UP APPOINTMENT FOR MORE REFILLS 30 capsule 0  . Fluoxetine HCl, PMDD, 20 MG CAPS Take 1 capsule (20 mg total)  by mouth daily. 14 each 0  . Glucosamine-Chondroit-Vit C-Mn (GLUCOSAMINE CHONDR 1500 COMPLX PO) Take 1,500 mg by mouth daily. TAKE 2 CAPSULES A DAY PER PATIENT     No current facility-administered medications for this visit.    No Known Allergies    Review of Systems: CONSTITUTIONAL:  No  fever, no chills, (+) weight gain unintentional weight changes HEAD/EYES/EARS/NOSE/THROAT: No  headache, no vision change CARDIAC: No  chest pain PSYCHIATRIC: (-) concerns with depression, No  concerns with anxiety, No sleep problems  Exam:  BP 136/83   Pulse 82   Ht 5\' 2"  (1.575 m)   Wt 186 lb (84.4 kg)   BMI 34.02 kg/m  Constitutional: VS see above. General Appearance: alert, well-developed, well-nourished, NAD Eyes:  Normal lids and conjunctive, non-icteric sclera,  Ears, Nose, Mouth, Throat: MMM, Normal external inspection ears/nares/mouth/lips/gums,  Respiratory: Normal respiratory effort.  Musculoskeletal: Gait normal.  Psychiatric: Normal judgment/insight. Normal mood and affect. Oriented x3. No thought disorder, no SI/HI  No flowsheet data found.    ASSESSMENT/PLAN: OK to continue Fluoxetine current dose, pt doing well.   Recurrent major depressive disorder, in partial remission (HCC)  Other depression - Plan: FLUoxetine (PROZAC) 40 MG capsule     All questions were answered. Visit summary with updated medication list and pertinent instructions was printed for patient. ER/RTC precautions were reviewed with the patient. Return for [place] sometime in the next 6 months or so, sooner if needed .

## 2017-03-03 ENCOUNTER — Encounter: Payer: Self-pay | Admitting: Physician Assistant

## 2017-03-03 ENCOUNTER — Ambulatory Visit (INDEPENDENT_AMBULATORY_CARE_PROVIDER_SITE_OTHER): Payer: Managed Care, Other (non HMO) | Admitting: Physician Assistant

## 2017-03-03 VITALS — BP 123/83 | HR 78 | Temp 98.2°F | Wt 177.0 lb

## 2017-03-03 DIAGNOSIS — G518 Other disorders of facial nerve: Secondary | ICD-10-CM | POA: Diagnosis not present

## 2017-03-03 DIAGNOSIS — G510 Bell's palsy: Secondary | ICD-10-CM | POA: Diagnosis not present

## 2017-03-03 MED ORDER — HYPROMELLOSE (GONIOSCOPIC) 2.5 % OP SOLN: 1.0000 [drp] | Freq: Three times a day (TID) | OPHTHALMIC | 0 refills | Status: DC | PRN

## 2017-03-03 MED ORDER — CYCLOBENZAPRINE HCL 5 MG PO TABS: 5.0000 mg | ORAL_TABLET | Freq: Three times a day (TID) | ORAL | 0 refills | Status: DC | PRN

## 2017-03-03 MED ORDER — TRAMADOL HCL 50 MG PO TABS: 50.0000 mg | ORAL_TABLET | Freq: Three times a day (TID) | ORAL | 0 refills | Status: DC | PRN

## 2017-03-03 MED ORDER — VALACYCLOVIR HCL 1 G PO TABS: 1000.0000 mg | ORAL_TABLET | Freq: Three times a day (TID) | ORAL | 0 refills | Status: AC

## 2017-03-03 MED ORDER — PREDNISONE 10 MG (48) PO TBPK: ORAL_TABLET | Freq: Every day | ORAL | 0 refills | Status: DC

## 2017-03-03 NOTE — Patient Instructions (Addendum)
Bell Palsy, Adult Bell palsy is a short-term inability to move muscles in part of the face. The inability to move (paralysis) results from inflammation or compression of the facial nerve, which travels along the skull and under the ear to the side of the face (7th cranial nerve). This nerve is responsible for facial movements that include blinking, closing the eyes, smiling, and frowning. What are the causes? The exact cause of this condition is not known. It may be caused by an infection from a virus, such as the chickenpox (herpes zoster), Epstein-Barr, or mumps virus. What increases the risk? You are more likely to develop this condition if:  You are pregnant.  You have diabetes.  You have had a recent infection in your nose, throat, or airways (upper respiratory infection).  You have a weakened body defense system (immune system).  You have had a facial injury, such as a fracture.  You have a family history of Bell palsy.  What are the signs or symptoms? Symptoms of this condition include:  Weakness on one side of the face.  Drooping eyelid and corner of the mouth.  Excessive tearing in one eye.  Difficulty closing the eyelid.  Dry eye.  Drooling.  Dry mouth.  Changes in taste.  Change in facial appearance.  Pain behind one ear.  Ringing in one or both ears.  Sensitivity to sound in one ear.  Facial twitching.  Headache.  Impaired speech.  Dizziness.  Difficulty eating or drinking.  Most of the time, only one side of the face is affected. Rarely, Bell palsy affects the whole face. How is this diagnosed? This condition is diagnosed based on:  Your symptoms.  Your medical history.  A physical exam.  You may also have to see health care providers who specialize in disorders of the nerves (neurologist) or diseases and conditions of the eye (ophthalmologist). You may have tests, such as:  A test to check for nerve damage (electromyogram).  Imaging  studies, such as CT or MRI scans.  Blood tests.  How is this treated? This condition affects every person differently. Sometimes symptoms go away without treatment within a couple weeks. If treatment is needed, it varies from person to person. The goal of treatment is to reduce inflammation and protect the eye from damage. Treatment for Bell palsy may include:  Medicines, such as: ? Steroids to reduce swelling and inflammation. ? Antiviral drugs. ? Pain relievers, including aspirin, acetaminophen, or ibuprofen.  Eye drops or ointment to keep your eye moist.  Eye protection, if you cannot close your eye.  Exercises or massage to regain muscle strength and function (physical therapy).  Follow these instructions at home:  Take over-the-counter and prescription medicines only as told by your health care provider.  If your eye is affected: ? Keep your eye moist with eye drops or ointment as told by your health care provider. ? Follow instructions for eye care and protection as told by your health care provider.  Do any physical therapy exercises as told by your health care provider.  Keep all follow-up visits as told by your health care provider. This is important. Contact a health care provider if:  You have a fever.  Your symptoms do not get better within 2-3 weeks, or your symptoms get worse.  Your eye is red, irritated, or painful.  You have new symptoms. Get help right away if:  You have weakness or numbness in a part of your body other than your face.    You have trouble swallowing.  You develop neck pain or stiffness.  You develop dizziness or shortness of breath. Summary  Bell palsy is a short-term inability to move muscles in part of the face. The inability to move (paralysis) results from inflammation or compression of the facial nerve.  This condition affects every person differently. Sometimes symptoms go away without treatment within a couple weeks.  If  treatment is needed, it varies from person to person. The goal of treatment is to reduce inflammation and protect the eye from damage.  Contact your health care provider if your symptoms do not get better within 2-3 weeks, or your symptoms get worse. This information is not intended to replace advice given to you by your health care provider. Make sure you discuss any questions you have with your health care provider. Document Released: 05/02/2005 Document Revised: 07/05/2016 Document Reviewed: 07/05/2016 Elsevier Interactive Patient Education  2018 [employer]  

## 2017-03-03 NOTE — Progress Notes (Signed)
HPI:                                                                Phyllis Glass is a 36 y.o. female who presents to [place]: [facility] today for facial weakness  Patient presents today with sudden onset left-sided facial weakness. Reports it began in the last two hours with the lower face/mouth and has gradually spread to her left eye and brow. She endorses some left sided tinnitus and hearing loss. Also endorses pain in her left cheek and temporal area. Denies vision change, dysphagia, slurred speech, headache, upper/lower extremity weakness, or gait disturbance. She reports history of Bell's palsy x 2 during her pregnancies. Last episode was approximately 10 years ago. She states she had residual neurologic complications and had to have facial re-animation surgery, which failed.  Past Medical History:  Diagnosis Date  . Allergic rhinitis 03/06/2014  . Bell palsy 03/06/2014  . Carpal tunnel syndrome 03/06/2014  . DUB (dysfunctional uterine bleeding) 03/06/2014  . Encounter for assisted reproductive fertility procedure cycle 02/03/2014   Overview:  PRIOR IVF CYCLES:  0 PRIOR FET CYCLES:  0 PRIOR [place] CYCLES:  0  G 3 P  2 Term  2 Preterm  0 Biochemical  0 SAb  1 ECTOPIC  0   Packet Mailed: 1st packet given at NP appt 02/03/14; Protocol packet mailed 02/28/14  Diagnosis:  S/p BTL reversal 2013  Pharmacy:  [facility] ordered 02/28/14  Orientation Date:   02/17/14  Insurance Coverage: No  PAIRS:  offered  IVF Consents Signed: YES Date: 02/17/14  Deposit Paid: Yes  Date: 02/24/14  FEMALE TESTING  AMH: 3.52  Date: 02-03-14  Prolactin: 10.6 Date: 02-03-14  TSH: 3.46  Date: 02-03-14  IVF Labs: WNL   Date: 02-03-14  Group/Type: A+  Genetic Screening:WNL 02/03/14  PAP Smear:    Date:  Teaching Appointment: Yes              Date: 04/08/14 @ 9  Uterine Cavity:  SIS WNL   Date:  06/2012  Mock ET and Measurements: yes Date:04/08/14  Prior ET:    Stylet  Needed:  Dilation this cycle?  FEMALE TESTING  Semen Analysis: TMC: 57.8 Motility: 46 Date: 02-18-14  Female labs: WNL    Date: 02/03/14  PROTOCOL  Sperm Source:    . Foot sprain 03/06/2014  . Neurocardiogenic syncope 12/22/2014   Overview:  DURING MEDICAL PROCEDURES PER PATIENT REPORT    Past Surgical History:  Procedure Laterality Date  . CESAREAN SECTION     [phone]  . FACIAL RECONSTRUCTION SURGERY  2011   For attempted nerve damage repair Bell's palsy   Social History  Substance Use Topics  . Smoking status: Never Smoker  . Smokeless tobacco: Never Used  . Alcohol use Not on file   family history includes Alcohol abuse in her maternal grandfather; Cancer in her paternal grandmother; Diabetes in her paternal grandmother; Heart attack in her father, maternal aunt, maternal uncle, and paternal grandfather; Hyperlipidemia in her father; Hypertension in her father.  ROS: negative except as noted in the HPI  Medications: Current Outpatient Prescriptions  Medication Sig Dispense Refill  . FLUoxetine (PROZAC) 40 MG capsule Take 1 capsule (40 mg total) by mouth daily.  90 capsule 3   No current facility-administered medications for this visit.    No Known Allergies     Objective:  BP 123/83   Pulse 78   Temp 98.2 F (36.8 C) (Oral)   Wt 177 lb (80.3 kg)   BMI 32.37 kg/m  Gen: well-groomed, not ill-appearing, no acute distress HEENT: head normocephalic, atraumatic; conjunctiva and cornea clear, left eye watering, left external canal obstructed by cerumen, no rash in the canal Pulm: Normal work of breathing Neuro:  Cranial nerve 7 palsy, PERRLA, no nystagmus, normal coordination, normal tone, no tremor MSK: strength 5/5 and symmetric in bilateral upper and lower extremities, normal gait and station Mental Status: alert and oriented x 3, speech articulate, and thought processes clear and goal-directed    Depression screen PHQ 2/9 08/11/2016  Altered sleeping 3  Tired,  decreased energy 3  Change in appetite 3  Feeling bad or failure about yourself  1  Trouble concentrating 1  Moving slowly or fidgety/restless 0  Suicidal thoughts 0     No results found for this or any previous visit (from the past 72 hour(s)). No results found.    Assessment and Plan: 35 y.o. female with   1. Facial nerve palsy - recurrent. Antivirals x 7 days, steroids x 12 days, tramadol for pain, artificial tears - placed referral to neurology given patient's history of residual complications - predniSONE (STERAPRED UNI-PAK 48 TAB) 10 MG (48) TBPK tablet; Take by mouth daily. Use as directed for taper  Dispense: 1 tablet; Refill: 0 - valACYclovir (VALTREX) 1000 MG tablet; Take 1 tablet (1,000 mg total) by mouth 3 (three) times daily.  Dispense: 21 tablet; Refill: 0 - Ambulatory referral to Neurology - hydroxypropyl methylcellulose / hypromellose (ISOPTO TEARS / GONIOVISC) 2.5 % ophthalmic solution; Place 1 drop into the left eye 3 (three) times daily as needed for dry eyes.  Dispense: 15 mL; Refill: 0  2. Neuralgic facial pain - traMADol (ULTRAM) 50 MG tablet; Take 1 tablet (50 mg total) by mouth every 8 (eight) hours as needed.  Dispense: 30 tablet; Refill: 0 - cyclobenzaprine (FLEXERIL) 5 MG tablet; Take 1 tablet (5 mg total) by mouth 3 (three) times daily as needed for muscle spasms.  Dispense: 30 tablet; Refill: 0  Patient education and anticipatory guidance given Patient agrees with treatment plan Follow-up in 2 weeks with PCP or sooner as needed if symptoms worsen or fail to improve  Darlyne Russian PA-C

## 2017-03-17 ENCOUNTER — Ambulatory Visit: Payer: Managed Care, Other (non HMO) | Admitting: Osteopathic Medicine

## 2017-08-04 ENCOUNTER — Encounter: Payer: Self-pay | Admitting: Family Medicine

## 2017-08-04 ENCOUNTER — Ambulatory Visit: Payer: Managed Care, Other (non HMO) | Admitting: Family Medicine

## 2017-08-04 VITALS — BP 138/83 | HR 77 | Ht 62.0 in | Wt 184.0 lb

## 2017-08-04 DIAGNOSIS — H60502 Unspecified acute noninfective otitis externa, left ear: Secondary | ICD-10-CM | POA: Diagnosis not present

## 2017-08-04 DIAGNOSIS — H6122 Impacted cerumen, left ear: Secondary | ICD-10-CM

## 2017-08-04 DIAGNOSIS — H6981 Other specified disorders of Eustachian tube, right ear: Secondary | ICD-10-CM

## 2017-08-04 DIAGNOSIS — G510 Bell's palsy: Secondary | ICD-10-CM | POA: Diagnosis not present

## 2017-08-04 MED ORDER — NEOMYCIN-POLYMYXIN-HC 3.5-10000-1 OT SOLN: 4.0000 [drp] | Freq: Four times a day (QID) | OTIC | 0 refills | Status: DC

## 2017-08-04 MED ORDER — CEFDINIR 300 MG PO CAPS: 300.0000 mg | ORAL_CAPSULE | Freq: Two times a day (BID) | ORAL | 0 refills | Status: DC

## 2017-08-04 MED ORDER — PREDNISONE 10 MG PO TABS: 30.0000 mg | ORAL_TABLET | Freq: Every day | ORAL | 0 refills | Status: DC

## 2017-08-04 NOTE — Progress Notes (Signed)
Phyllis Glass is a 37 y.o. female who presents to [place]: [place] today for left ear pain.  [place] notes a 1 day history of pain and pressure in both ears much worse on the left than the right.  She notes this is associated with decreased hearing and mildly worsening balance.  She has a pertinent medical history for Bell's palsy involving the left side of her face.  She denies any new facial motor weakness or numbness.  She has tried using over-the-counter ear irrigation kits with some success in her right ear but no success in her left.  She denies any fevers or chills vomiting or diarrhea.  She denies any cough congestion runny nose or itchy watery eyes.  She denies much history of seasonal allergies..   Past Medical History:  Diagnosis Date  . Allergic rhinitis 03/06/2014  . Bell palsy 03/06/2014  . Carpal tunnel syndrome 03/06/2014  . DUB (dysfunctional uterine bleeding) 03/06/2014  . Encounter for assisted reproductive fertility procedure cycle 02/03/2014  . Foot sprain 03/06/2014  . Neurocardiogenic syncope 12/22/2014   Overview:  DURING MEDICAL PROCEDURES PER PATIENT REPORT    Past Surgical History:  Procedure Laterality Date  . CESAREAN SECTION     [phone]  . FACIAL RECONSTRUCTION SURGERY  2011   For attempted nerve damage repair Bell's palsy   Social History   Tobacco Use  . Smoking status: Never Smoker  . Smokeless tobacco: Never Used  Substance Use Topics  . Alcohol use: Not on file   family history includes Alcohol abuse in her maternal grandfather; Cancer in her paternal grandmother; Diabetes in her paternal grandmother; Heart attack in her father, maternal aunt, maternal uncle, and paternal grandfather; Hyperlipidemia in her father; Hypertension in her father.  ROS as above:  Medications: Current Outpatient Medications  Medication Sig  Dispense Refill  . cefdinir (OMNICEF) 300 MG capsule Take 1 capsule (300 mg total) by mouth 2 (two) times daily. 14 capsule 0  . cyclobenzaprine (FLEXERIL) 5 MG tablet Take 1 tablet (5 mg total) by mouth 3 (three) times daily as needed for muscle spasms. 30 tablet 0  . FLUoxetine (PROZAC) 40 MG capsule Take 1 capsule (40 mg total) by mouth daily. 90 capsule 3  . hydroxypropyl methylcellulose / hypromellose (ISOPTO TEARS / GONIOVISC) 2.5 % ophthalmic solution Place 1 drop into the left eye 3 (three) times daily as needed for dry eyes. 15 mL 0  . neomycin-polymyxin-hydrocortisone (CORTISPORIN) OTIC solution Place 4 drops into the left ear 4 (four) times daily. 10 mL 0  . predniSONE (DELTASONE) 10 MG tablet Take 3 tablets (30 mg total) by mouth daily with breakfast. 15 tablet 0  . traMADol (ULTRAM) 50 MG tablet Take 1 tablet (50 mg total) by mouth every 8 (eight) hours as needed. 30 tablet 0   No current facility-administered medications for this visit.    Allergies  Allergen Reactions  . Gabapentin     weakness    Health Maintenance Health Maintenance  Topic Date Due  . TETANUS/TDAP  08/11/2017 (Originally 02/14/2016)  . INFLUENZA VACCINE  05/15/2018 (Originally 12/14/2016)  . PAP SMEAR  01/23/2020  . HIV Screening  Completed     Exam:  BP 138/83   Pulse 77   Ht 5\' 2"  (1.575 m)   Wt 184 lb (83.5 kg)   BMI 33.65 kg/m  Gen: Well NAD HEENT: EOMI,  MMM right ear canal with mild cerumen not impacting.  The right tympanic membrane is retracted with no erythema.  The right ear is nontender to motion and the mastoid is nontender. The left ear canal is occluded by cerumen.  After irrigation with removal of cerumen the remaining ear canal is significantly erythematous.  The tympanic membrane appears intact non-retracted and nonerythematous.  The ear is nontender to motion and the mastoid is nontender. Persistent left facial motor weakness present with facial droop mildly on the left  side. Lungs: Normal work of breathing. CTABL Heart: RRR no MRG Abd: NABS, Soft. Nondistended, Nontender Exts: Brisk capillary refill, warm and well perfused.    Patient had significant improvement in symptoms following irrigation of cerumen of the left ear.   Assessment and Plan: 37 y.o. female with  Cerumen impaction and resultant otitis externa of the left ear and eustachian tube dysfunction of the right ear. Patient had considerable improvement following irrigation.  However she has persistent erythema likely as a result of the cerumen impaction.  Plan for treatment with Cortisporin eardrops. Her backup plan if she fails to improve and is prednisone and then Omnicef.  These prescriptions were printed however I think she will not likely have to use them.  She understands the risks and benefits of both Omnicef and prednisone after discussion.  The right ear shows eustachian tube dysfunction with retracted tympanic membrane.  Consider over-the-counter medications as needed such as Zyrtec-D.  Prednisone may help if she does not improve.  Her left-sided symptoms are obviously concerning for possible re-exacerbation of her Bell's palsy.  I think this is unlikely but if she starts developing facial motor symptoms plan to start the prednisone early and return to clinic.  No orders of the defined types were placed in this encounter.  Meds ordered this encounter  Medications  . neomycin-polymyxin-hydrocortisone (CORTISPORIN) OTIC solution    Sig: Place 4 drops into the left ear 4 (four) times daily.    Dispense:  10 mL    Refill:  0  . cefdinir (OMNICEF) 300 MG capsule    Sig: Take 1 capsule (300 mg total) by mouth 2 (two) times daily.    Dispense:  14 capsule    Refill:  0  . predniSONE (DELTASONE) 10 MG tablet    Sig: Take 3 tablets (30 mg total) by mouth daily with breakfast.    Dispense:  15 tablet    Refill:  0     Discussed warning signs or symptoms. Please see discharge  instructions. Patient expresses understanding.

## 2017-08-04 NOTE — Patient Instructions (Signed)
Thank you for coming in today. Use the ear drops  If not better next step is omnicef antibiotics and prednisone.  Return as needed.  Call or go to the emergency room if you get worse, have trouble breathing, have chest pains, or palpitations.    Earwax Buildup, Adult The ears produce a substance called earwax that helps keep bacteria out of the ear and protects the skin in the ear canal. Occasionally, earwax can build up in the ear and cause discomfort or hearing loss. What increases the risk? This condition is more likely to develop in people who:  Are female.  Are elderly.  Naturally produce more earwax.  Clean their ears often with cotton swabs.  Use earplugs often.  Use in-ear headphones often.  Wear hearing aids.  Have narrow ear canals.  Have earwax that is overly thick or sticky.  Have eczema.  Are dehydrated.  Have excess hair in the ear canal.  What are the signs or symptoms? Symptoms of this condition include:  Reduced or muffled hearing.  A feeling of fullness in the ear or feeling that the ear is plugged.  Fluid coming from the ear.  Ear pain.  Ear itch.  Ringing in the ear.  Coughing.  An obvious piece of earwax that can be seen inside the ear canal.  How is this diagnosed? This condition may be diagnosed based on:  Your symptoms.  Your medical history.  An ear exam. During the exam, your health care provider will look into your ear with an instrument called an otoscope.  You may have tests, including a hearing test. How is this treated? This condition may be treated by:  Using ear drops to soften the earwax.  Having the earwax removed by a health care provider. The health care provider may: ? Flush the ear with water. ? Use an instrument that has a loop on the end (curette). ? Use a suction device.  Surgery to remove the wax buildup. This may be done in severe cases.  Follow these instructions at home:  Take over-the-counter  and prescription medicines only as told by your health care provider.  Do not put any objects, including cotton swabs, into your ear. You can clean the opening of your ear canal with a washcloth or facial tissue.  Follow instructions from your health care provider about cleaning your ears. Do not over-clean your ears.  Drink enough fluid to keep your urine clear or pale yellow. This will help to thin the earwax.  Keep all follow-up visits as told by your health care provider. If earwax builds up in your ears often or if you use hearing aids, consider seeing your health care provider for routine, preventive ear cleanings. Ask your health care provider how often you should schedule your cleanings.  If you have hearing aids, clean them according to instructions from the manufacturer and your health care provider. Contact a health care provider if:  You have ear pain.  You develop a fever.  You have blood, pus, or other fluid coming from your ear.  You have hearing loss.  You have ringing in your ears that does not go away.  Your symptoms do not improve with treatment.  You feel like the room is spinning (vertigo). Summary  Earwax can build up in the ear and cause discomfort or hearing loss.  The most common symptoms of this condition include reduced or muffled hearing and a feeling of fullness in the ear or feeling that  the ear is plugged.  This condition may be diagnosed based on your symptoms, your medical history, and an ear exam.  This condition may be treated by using ear drops to soften the earwax or by having the earwax removed by a health care provider.  Do not put any objects, including cotton swabs, into your ear. You can clean the opening of your ear canal with a washcloth or facial tissue. This information is not intended to replace advice given to you by your health care provider. Make sure you discuss any questions you have with your health care provider. Document  Released: 06/09/2004 Document Revised: 07/13/2016 Document Reviewed: 07/13/2016 Elsevier Interactive Patient Education  [employer].

## 2017-08-14 ENCOUNTER — Telehealth: Payer: Self-pay

## 2017-08-14 MED ORDER — OSELTAMIVIR PHOSPHATE 75 MG PO CAPS: 75.0000 mg | ORAL_CAPSULE | Freq: Two times a day (BID) | ORAL | 0 refills | Status: DC

## 2017-08-14 NOTE — Telephone Encounter (Signed)
Pt called requesting Tamiflu prophylactic. As per pt, current has 5 household members diagnosed with Flu. As per pt, "feeling off" today. No specific symptom noted. Sent Tamilflu 75mg , bid qty: 10 to preferred pharmacy. Reviewed most recent labs, looked ok (as per [facility]). Pt has been updated.

## 2017-08-14 NOTE — Telephone Encounter (Signed)
Agree with above plan. 

## 2017-08-14 NOTE — Telephone Encounter (Signed)
Pt called requesting tamiflu prophylactic.

## 2017-08-15 NOTE — Telephone Encounter (Signed)
Noted  

## 2017-09-07 ENCOUNTER — Telehealth: Payer: Self-pay

## 2017-09-07 DIAGNOSIS — Z0000 Encounter for general adult medical examination without abnormal findings: Secondary | ICD-10-CM

## 2017-09-07 NOTE — Telephone Encounter (Signed)
Pt left a vm msg requesting general lab order for physical check up. Pt has an upcoming appt on 09/12/17. Thanks.

## 2017-09-08 NOTE — Telephone Encounter (Signed)
Orders are in, she should be able to go to the lab fasting at her convenience

## 2017-09-08 NOTE — Telephone Encounter (Signed)
Pt has been updated.  

## 2017-09-10 ENCOUNTER — Other Ambulatory Visit: Payer: Self-pay | Admitting: Osteopathic Medicine

## 2017-09-10 DIAGNOSIS — F3289 Other specified depressive episodes: Secondary | ICD-10-CM

## 2017-09-11 LAB — COMPLETE METABOLIC PANEL WITH GFR
AG Ratio: 1.8 (calc) (ref 1.0–2.5)
ALBUMIN MSPROF: 4.4 g/dL (ref 3.6–5.1)
ALKALINE PHOSPHATASE (APISO): 51 U/L (ref 33–115)
ALT: 10 U/L (ref 6–29)
AST: 14 U/L (ref 10–30)
BUN: 10 mg/dL (ref 7–25)
CALCIUM: 9.1 mg/dL (ref 8.6–10.2)
CO2: 28 mmol/L (ref 20–32)
CREATININE: 0.8 mg/dL (ref 0.50–1.10)
Chloride: 104 mmol/L (ref 98–110)
GFR, EST AFRICAN AMERICAN: 108 mL/min/{1.73_m2} (ref >=60)
GFR, EST NON AFRICAN AMERICAN: 94 mL/min/{1.73_m2} (ref >=60)
GLOBULIN: 2.5 g/dL (ref 1.9–3.7)
Glucose, Bld: 97 mg/dL (ref 65–99)
Potassium: 4.6 mmol/L (ref 3.5–5.3)
SODIUM: 138 mmol/L (ref 135–146)
TOTAL PROTEIN: 6.9 g/dL (ref 6.1–8.1)
Total Bilirubin: 0.4 mg/dL (ref 0.2–1.2)

## 2017-09-11 LAB — CBC
HEMATOCRIT: 38.9 % (ref 35.0–45.0)
HEMOGLOBIN: 12.8 g/dL (ref 11.7–15.5)
MCH: 27.5 pg (ref 27.0–33.0)
MCHC: 32.9 g/dL (ref 32.0–36.0)
MCV: 83.5 fL (ref 80.0–100.0)
MPV: 10.6 fL (ref 7.5–12.5)
Platelets: 243 10*3/uL (ref 140–400)
RBC: 4.66 10*6/uL (ref 3.80–5.10)
RDW: 12.6 % (ref 11.0–15.0)
WBC: 5.1 10*3/uL (ref 3.8–10.8)

## 2017-09-11 LAB — LIPID PANEL
CHOL/HDL RATIO: 3.3 (calc) (ref <5.0)
Cholesterol: 195 mg/dL (ref <200)
HDL: 59 mg/dL (ref >50)
LDL Cholesterol (Calc): 111 mg/dL (calc) — ABNORMAL HIGH
NON-HDL CHOLESTEROL (CALC): 136 mg/dL — AB (ref <130)
Triglycerides: 130 mg/dL (ref <150)

## 2017-09-11 NOTE — Telephone Encounter (Signed)
Left a brief vm msg for pt regarding med refill request. Call back information provided.

## 2017-09-11 NOTE — Telephone Encounter (Signed)
[facility] pharmacy requesting med refill for prozac med. Pt has an appt on 09/12/17 w/pcp. Pls advise if refill appropriate. Thanks.

## 2017-09-12 ENCOUNTER — Ambulatory Visit (INDEPENDENT_AMBULATORY_CARE_PROVIDER_SITE_OTHER): Payer: Managed Care, Other (non HMO) | Admitting: Osteopathic Medicine

## 2017-09-12 ENCOUNTER — Encounter: Payer: Self-pay | Admitting: Osteopathic Medicine

## 2017-09-12 VITALS — BP 123/82 | HR 80 | Temp 98.1°F | Wt 183.2 lb

## 2017-09-12 DIAGNOSIS — F3289 Other specified depressive episodes: Secondary | ICD-10-CM

## 2017-09-12 DIAGNOSIS — Z0000 Encounter for general adult medical examination without abnormal findings: Secondary | ICD-10-CM | POA: Diagnosis not present

## 2017-09-12 DIAGNOSIS — Z23 Encounter for immunization: Secondary | ICD-10-CM

## 2017-09-12 MED ORDER — FLUOXETINE HCL 40 MG PO CAPS: 40.0000 mg | ORAL_CAPSULE | Freq: Every day | ORAL | 3 refills | Status: DC

## 2017-09-12 NOTE — Progress Notes (Signed)
HPI: Phyllis Glass is a 37 y.o. female who  has a past medical history of Allergic rhinitis (03/06/2014), Bell palsy (03/06/2014), Carpal tunnel syndrome (03/06/2014), DUB (dysfunctional uterine bleeding) (03/06/2014), Encounter for assisted reproductive fertility procedure cycle (02/03/2014), Foot sprain (03/06/2014), and Neurocardiogenic syncope (12/22/2014).  she presents to [facility] today, 09/12/17,  for chief complaint of: Annual physical  Patient here for annual physical / wellness exam.  See preventive care reviewed as below.  Recent labs reviewed in detail with the patient.   Additional concerns today include:  None  - has some paperwork for renewal of foster parent status, we faxed this once completed. See scanned docs.     Past medical, surgical, social and family history reviewed:  Patient Active Problem List   Diagnosis Date Noted  . Neurocardiogenic syncope 12/22/2014  . Allergic rhinitis 03/06/2014  . Anxiety disorder 03/06/2014  . Bell palsy 03/06/2014  . Carpal tunnel syndrome 03/06/2014  . Chronic infection of sinus 03/06/2014  . Recurrent depressive disorder ([place]) 03/06/2014  . DUB (dysfunctional uterine bleeding) 03/06/2014  . Fatigue 03/06/2014  . Foot sprain 03/06/2014  . Arthralgia of foot 03/06/2014  . Keratosis pilaris 03/06/2014  . 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 Has a tremor 03/06/2014  . Encounter for assisted reproductive fertility procedure cycle 02/03/2014    Past Surgical History:  Procedure Laterality Date  . CESAREAN SECTION     [phone]  . FACIAL RECONSTRUCTION SURGERY  2011   For attempted nerve damage repair Bell's palsy    Social History   Tobacco Use  . Smoking status: Never Smoker  . Smokeless tobacco: Never Used  Substance Use Topics  . Alcohol use: Not on file    Family History  Problem Relation Age of Onset  . Hyperlipidemia Father   . Hypertension Father   .  Heart attack Father   . Alcohol abuse Maternal Grandfather   . Cancer Paternal Grandmother        BREAST  . Diabetes Paternal Grandmother   . Heart attack Maternal Aunt   . Heart attack Maternal Uncle   . Heart attack Paternal Grandfather      Current medication list and allergy/intolerance information reviewed:    Current Outpatient Medications  Medication Sig Dispense Refill  . FLUoxetine (PROZAC) 40 MG capsule TAKE 1 CAPSULE(40 MG) BY MOUTH DAILY 90 capsule 0  . cefdinir (OMNICEF) 300 MG capsule Take 1 capsule (300 mg total) by mouth 2 (two) times daily. (Patient not taking: Reported on 09/12/2017) 14 capsule 0  . cyclobenzaprine (FLEXERIL) 5 MG tablet Take 1 tablet (5 mg total) by mouth 3 (three) times daily as needed for muscle spasms. (Patient not taking: Reported on 09/12/2017) 30 tablet 0  . hydroxypropyl methylcellulose / hypromellose (ISOPTO TEARS / GONIOVISC) 2.5 % ophthalmic solution Place 1 drop into the left eye 3 (three) times daily as needed for dry eyes. (Patient not taking: Reported on 09/12/2017) 15 mL 0  . neomycin-polymyxin-hydrocortisone (CORTISPORIN) OTIC solution Place 4 drops into the left ear 4 (four) times daily. (Patient not taking: Reported on 09/12/2017) 10 mL 0  . oseltamivir (TAMIFLU) 75 MG capsule Take 1 capsule (75 mg total) by mouth 2 (two) times daily. (Patient not taking: Reported on 09/12/2017) 10 capsule 0  . predniSONE (DELTASONE) 10 MG tablet Take 3 tablets (30 mg total) by mouth daily with breakfast. (Patient not taking: Reported on 09/12/2017) 15 tablet 0  . traMADol (ULTRAM) 50 MG tablet Take 1  tablet (50 mg total) by mouth every 8 (eight) hours as needed. (Patient not taking: Reported on 09/12/2017) 30 tablet 0   No current facility-administered medications for this visit.     Allergies  Allergen Reactions  . Gabapentin     weakness      Review of Systems:  Constitutional:  No  fever, no chills, No recent illness, No unintentional weight  changes. No significant fatigue.   HEENT: No  headache, no vision change, no hearing change, No sore throat, No  sinus pressure  Cardiac: No  chest pain, No  pressure, No palpitations, No  Orthopnea  Respiratory:  No  shortness of breath. No  Cough  Gastrointestinal: No  abdominal pain, No  nausea, No  vomiting,  No  blood in stool, No  diarrhea, No  constipation   Musculoskeletal: No new myalgia/arthralgia  Skin: No  Rash, No other wounds/concerning lesions  Genitourinary: No  incontinence, No  abnormal genital bleeding, No abnormal genital discharge  Hem/Onc: No  easy bruising/bleeding, No  abnormal lymph node  Endocrine: No cold intolerance,  No heat intolerance. No polyuria/polydipsia/polyphagia   Neurologic: No  weakness, No  dizziness, No  slurred speech/focal weakness/facial droop  Psychiatric: No  concerns with depression, No  concerns with anxiety, No sleep problems, No mood problems  Exam:  BP (!) 142/74 (BP Location: Left Arm, Patient Position: Sitting, Cuff Size: Normal)   Pulse 78   Temp 98.1 F (36.7 C) (Oral)   Wt 183 lb 3.2 oz (83.1 kg)   LMP 09/04/2017   BMI 33.51 kg/m   Constitutional: VS see above. General Appearance: alert, well-developed, well-nourished, NAD  Eyes: Normal lids and conjunctive, non-icteric sclera  Ears, Nose, Mouth, Throat: MMM, Normal external inspection ears/nares/mouth/lips/gums. TM normal bilaterally. Pharynx/tonsils no erythema, no exudate. Nasal mucosa normal.   Neck: No masses, trachea midline. No thyroid enlargement. No tenderness/mass appreciated. No lymphadenopathy  Respiratory: Normal respiratory effort. no wheeze, no rhonchi, no rales  Cardiovascular: S1/S2 normal, no murmur, no rub/gallop auscultated. RRR. No lower extremity edema.   Gastrointestinal: Nontender, no masses. No hepatomegaly, no splenomegaly. No hernia appreciated. Bowel sounds normal. Rectal exam deferred.   Musculoskeletal: Gait normal. No  clubbing/cyanosis of digits.   Neurological: Normal balance/coordination. No tremor. +L facial cranial nerve deficit on limited exam but otherwise inact. Cerebellar reflexes intact.   Skin: warm, dry, intact. No rash/ulcer. No concerning nevi or subq nodules on limited exam.  Psychiatric: Normal judgment/insight. Normal mood and affect. Oriented x3.    Results for orders placed or performed in visit on 09/07/17 (from the past 72 hour(s))  CBC     Status: None   Collection Time: 09/11/17  8:31 AM  Result Value Ref Range   WBC 5.1 3.8 - 10.8 Thousand/uL   RBC 4.66 3.80 - 5.10 Million/uL   Hemoglobin 12.8 11.7 - 15.5 g/dL   HCT 38.9 35.0 - 45.0 %   MCV 83.5 80.0 - 100.0 fL   MCH 27.5 27.0 - 33.0 pg   MCHC 32.9 32.0 - 36.0 g/dL   RDW 12.6 11.0 - 15.0 %   Platelets 243 140 - 400 Thousand/uL   MPV 10.6 7.5 - 12.5 fL  COMPLETE METABOLIC PANEL WITH GFR     Status: None   Collection Time: 09/11/17  8:31 AM  Result Value Ref Range   Glucose, Bld 97 65 - 99 mg/dL    Comment: .            Fasting reference  interval .    BUN 10 7 - 25 mg/dL   Creat 0.80 0.50 - 1.10 mg/dL   GFR, Est Non African American 94 > OR = 60 mL/min/1.81m   GFR, Est African American 108 > OR = 60 mL/min/1.786m  BUN/Creatinine Ratio NOT APPLICABLE 6 - 22 (calc)   Sodium 138 135 - 146 mmol/L   Potassium 4.6 3.5 - 5.3 mmol/L   Chloride 104 98 - 110 mmol/L   CO2 28 20 - 32 mmol/L   Calcium 9.1 8.6 - 10.2 mg/dL   Total Protein 6.9 6.1 - 8.1 g/dL   Albumin 4.4 3.6 - 5.1 g/dL   Globulin 2.5 1.9 - 3.7 g/dL (calc)   AG Ratio 1.8 1.0 - 2.5 (calc)   Total Bilirubin 0.4 0.2 - 1.2 mg/dL   Alkaline phosphatase (APISO) 51 33 - 115 U/L   AST 14 10 - 30 U/L   ALT 10 6 - 29 U/L  Lipid panel     Status: Abnormal   Collection Time: 09/11/17  8:31 AM  Result Value Ref Range   Cholesterol 195 <200 mg/dL   HDL 59 >50 mg/dL   Triglycerides 130 <150 mg/dL   LDL Cholesterol (Calc) 111 (H) mg/dL (calc)    Comment: Reference  range: <100 . Desirable range <100 mg/dL for primary prevention;   <70 mg/dL for patients with CHD or diabetic patients  with > or = 2 CHD risk factors. . Marland KitchenDL-C is now calculated using the Martin-Hopkins  calculation, which is a validated novel method providing  better accuracy than the Friedewald equation in the  estimation of LDL-C.  MaCresenciano Genret al. JAAnnamaria [id number];[id number]  (http://education.[employer].com/faq/FAQ164)    Total CHOL/HDL Ratio 3.3 <5.0 (calc)   Non-HDL Cholesterol (Calc) 136 (H) <130 mg/dL (calc)    Comment: For patients with diabetes plus 1 major ASCVD risk  factor, treating to a non-HDL-C goal of <100 mg/dL  (LDL-C of <70 mg/dL) is considered a therapeutic  option.       ASSESSMENT/PLAN:   Annual physical exam  Need for Tdap vaccination - Plan: Tdap vaccine greater than or equal to 7yo IM  Other depression - Plan: FLUoxetine (PROZAC) 40 MG capsule   FEMALE PREVENTIVE CARE Updated 09/12/17   ANNUAL SCREENING/COUNSELING  Diet/Exercise - HEALTHY HABITS DISCUSSED TO DECREASE CV RISK Social History   Tobacco Use  Smoking Status Never Smoker  Smokeless Tobacco Never Used   Social History   Substance and Sexual Activity  Alcohol Use Yes  . Alcohol/week: 0.0 oz   Comment: 1-2 per week   [facility] 09/12/2017  Decreased Interest 0  Down, Depressed, Hopeless 1  PHQ - 2 Score 1  Altered sleeping 0  Tired, decreased energy 0  Change in appetite 2  Feeling bad or failure about yourself  0  Trouble concentrating 0  Moving slowly or fidgety/restless 0  Suicidal thoughts 0  PHQ-9 Score 3  Difficult doing work/chores Not difficult at all    Domestic violence concerns - no  [place] - Yes with female.  Need/want STI testing today? - no  Concerns about libido or pain with sex? - no  INFECTIOUS DISEASE SCREENING  HIV - does not need  GC/CT - does  not need  HepC - DOB [birth year] - does not need  TB - does not need  DISEASE SCREENING  Lipid - does not need  DM2 - does not need  Osteoporosis - women age 70+ - does not need  CANCER SCREENING  Cervical - does not need  Breast - does not need  Lung - does not need  Colon - does not need  ADULT VACCINATION  Influenza - annual vaccine recommended  Td - booster every 10 years   Zoster - Shingrix recommended 50+  PCV13 - was not indicated  PPSV23 - was not indicated Immunization History  Administered Date(s) Administered  . DTaP 08/28/1979, 10/30/1979, 12/31/1979, 07/17/1996  . IPV 08/28/1979, 10/30/1979, 12/31/1979  . Influenza-Unspecified 03/14/2001  . MMR 05/28/1981, 07/27/1996  . Tdap 02/13/2006, 09/12/2017       Visit summary with medication list and pertinent instructions was printed for patient to review. All questions at time of visit were answered - patient instructed to contact office with any additional concerns. ER/RTC precautions were reviewed with the patient.   Follow-up plan: Return in about 1 year (around 09/13/2018) for annual check-up, sooner if needed .    Please note: voice recognition software was used to produce this document, and typos may escape review. Please contact Dr. Sheppard Coil for any needed clarifications.

## 2017-09-27 ENCOUNTER — Telehealth: Payer: Self-pay

## 2017-09-27 MED ORDER — ALPRAZOLAM 0.5 MG PO TABS: 0.5000 mg | ORAL_TABLET | Freq: Two times a day (BID) | ORAL | 0 refills | Status: DC | PRN

## 2017-09-27 MED ORDER — FLUOXETINE HCL 60 MG PO TABS: 60.0000 mg | ORAL_TABLET | Freq: Every day | ORAL | 1 refills | Status: DC

## 2017-09-27 NOTE — Telephone Encounter (Signed)
Pt has been updated.  

## 2017-09-27 NOTE — Telephone Encounter (Signed)
Sent meds Office visit will be required prior to consideration of refills or other med adjustments, I encourage her to come see me sometime in the next month!

## 2017-09-27 NOTE — Telephone Encounter (Signed)
Pt left vm msg stating she is going through some severe personal issues with family. Pt wants to increase fluoxetine, because current dose is not working. Pt is also requesting a short term rx for xanax. [facility]. Pt is aware she may need an appt to be seen. Pls advise, thanks.

## 2017-10-17 ENCOUNTER — Ambulatory Visit (INDEPENDENT_AMBULATORY_CARE_PROVIDER_SITE_OTHER): Payer: Managed Care, Other (non HMO) | Admitting: Osteopathic Medicine

## 2017-10-17 ENCOUNTER — Encounter: Payer: Self-pay | Admitting: Osteopathic Medicine

## 2017-10-17 VITALS — BP 126/71 | HR 80 | Temp 98.2°F | Wt 183.7 lb

## 2017-10-17 DIAGNOSIS — F4323 Adjustment disorder with mixed anxiety and depressed mood: Secondary | ICD-10-CM | POA: Diagnosis not present

## 2017-10-17 MED ORDER — ALPRAZOLAM 0.5 MG PO TABS: 0.2500 mg | ORAL_TABLET | Freq: Two times a day (BID) | ORAL | 0 refills | Status: DC | PRN
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
## 2017-10-17 NOTE — Patient Instructions (Signed)
Let me know if/when you'd like refills on the fluoxetine or alprazolam Let me know if/when you'd like to decrease fluoxetine back to 40 mg  Can try 1/2 tablet of alprazolam as needed, twice per day, sparing use

## 2017-10-23 ENCOUNTER — Telehealth: Payer: Self-pay

## 2017-10-23 MED ORDER — ALPRAZOLAM 0.5 MG PO TABS: 0.2500 mg | ORAL_TABLET | Freq: Two times a day (BID) | ORAL | 0 refills | Status: DC | PRN

## 2017-10-23 NOTE — Telephone Encounter (Signed)
Sent!

## 2017-10-23 NOTE — Telephone Encounter (Signed)
Pt left vm msg requesting med RF for alprazolam. Requesting for rx to be sent to [facility] in [place]. Thanks.

## 2017-10-24 NOTE — Telephone Encounter (Signed)
Pt advised.

## 2017-11-24 ENCOUNTER — Other Ambulatory Visit: Payer: Self-pay | Admitting: Osteopathic Medicine

## 2017-12-27 ENCOUNTER — Other Ambulatory Visit: Payer: Self-pay | Admitting: Osteopathic Medicine

## 2018-01-17 ENCOUNTER — Telehealth: Payer: Self-pay

## 2018-01-17 NOTE — Telephone Encounter (Signed)
Patient notified of MD instructions. KG LPN

## 2018-01-17 NOTE — Telephone Encounter (Signed)
Pt left a vm msg stating she is having one of her "flare ups" on her face. The only rx that helped in the past was flexeril rx. Pt will be traveling out of state & is requesting an update. Pls advise, thanks.

## 2018-01-17 NOTE — Telephone Encounter (Signed)
I don't have any documentation of previous discussion of these symptoms in her last couple visits. Hx Bell's Palsy but this really shouldn't cause pain. Needs visit to evaluate.

## 2018-01-17 NOTE — Telephone Encounter (Signed)
Pt called - she stated that she continues to have some facial paralysis or pinched nerve with pain. Pt is requesting a rx for muscle relaxant to be sent to pharmacy. Pls advise, thanks.

## 2018-01-26 ENCOUNTER — Other Ambulatory Visit: Payer: Self-pay | Admitting: Osteopathic Medicine

## 2018-02-24 ENCOUNTER — Other Ambulatory Visit: Payer: Self-pay | Admitting: Osteopathic Medicine

## 2018-03-25 ENCOUNTER — Other Ambulatory Visit: Payer: Self-pay | Admitting: Osteopathic Medicine

## 2018-03-28 ENCOUNTER — Telehealth: Payer: Self-pay

## 2018-03-28 DIAGNOSIS — Z803 Family history of malignant neoplasm of breast: Secondary | ICD-10-CM

## 2018-03-28 NOTE — Telephone Encounter (Signed)
Pt called requesting a referral for mammogram. As per pt, has a strong family history of breast cancer and wants to complete mammogram before she turns 25 yrs of age. Pls advise, thanks.

## 2018-04-02 NOTE — Telephone Encounter (Signed)
Called Pt, family Hx as follows:  Paternal Grandmother - Breast Cancer, Dx age 38 and Stomach Cancer, Dx age 55 Great Paternal Grandmother - Breast Cancer, Dx age 86 Paternal 82 - Breast Cancer, Dx age 79 Paternal Grandfathers - Melanoma, Dx age 68  Will update chart.

## 2018-04-02 NOTE — Telephone Encounter (Signed)
Can we call patient anc confirm the family history? I only have on file one grandmother with breast cancer, this does not put her at increased risk and a mammogram will not be paid for by insurance. Also, there are other screening tests to consider for high risk patients.   Let's call and confirm family history and see if she needs an appointment to discuss screening options.

## 2018-04-11 ENCOUNTER — Telehealth: Payer: Self-pay | Admitting: Genetic Counselor

## 2018-04-11 NOTE — Telephone Encounter (Signed)
Reviewed family history, breast cancer in grandparents or other second-degree relatives does not necessarily warrant early testing, I sent a referral to the genetic counselors at the cancer center to talk about this more, whether she may benefit from early testing or genetic testing, I'm not sure that insurance will cover a mammogram for screening purposes

## 2018-04-11 NOTE — Telephone Encounter (Signed)
Scheduled appt per referral for genetics - left message for patient with appt date and time.

## 2018-04-11 NOTE — Telephone Encounter (Signed)
Left VM for Pt to return clinic call, callback information provided.

## 2018-04-11 NOTE — Addendum Note (Signed)
Addended by: Maryla Morrow on: 04/11/2018 07:44 AM   Modules accepted: Orders

## 2018-04-22 ENCOUNTER — Other Ambulatory Visit: Payer: Self-pay | Admitting: Osteopathic Medicine

## 2018-05-02 ENCOUNTER — Other Ambulatory Visit: Payer: Self-pay | Admitting: Osteopathic Medicine

## 2018-05-02 DIAGNOSIS — Z1231 Encounter for screening mammogram for malignant neoplasm of breast: Secondary | ICD-10-CM

## 2018-05-02 DIAGNOSIS — R5381 Other malaise: Secondary | ICD-10-CM

## 2018-05-04 ENCOUNTER — Encounter: Payer: Self-pay | Admitting: Genetic Counselor

## 2018-05-07 ENCOUNTER — Telehealth: Payer: Self-pay | Admitting: Genetic Counselor

## 2018-05-07 ENCOUNTER — Telehealth: Payer: Self-pay

## 2018-05-07 ENCOUNTER — Inpatient Hospital Stay: Payer: Managed Care, Other (non HMO)

## 2018-05-07 ENCOUNTER — Inpatient Hospital Stay: Payer: Managed Care, Other (non HMO) | Admitting: Genetic Counselor

## 2018-05-07 NOTE — Telephone Encounter (Signed)
Pt called requesting a referral for diagnostic mammogram. As per pt, she got the referral for genetic testing. Pls advise. Thanks.

## 2018-05-07 NOTE — Telephone Encounter (Signed)
Patient wanted to know if she would get a referral for a mammogram today at her appointment.  I explained that I could not refer her for a mammogram, but if she decided to undergo genetic testing and was positive that we would refer her to our high risk breast clinic who could then refer her for a mammogram.  She reports that she is currently having breast pain.  I asked if she can get in with her PCP and get a referral based on that?  She states she has an appointment on 12/26.  She will CB and r/s her appointment at a later date.  Appointment for today is cancelled.

## 2018-05-10 ENCOUNTER — Ambulatory Visit (INDEPENDENT_AMBULATORY_CARE_PROVIDER_SITE_OTHER): Payer: Managed Care, Other (non HMO) | Admitting: Osteopathic Medicine

## 2018-05-10 ENCOUNTER — Encounter: Payer: Self-pay | Admitting: Osteopathic Medicine

## 2018-05-10 VITALS — BP 124/71 | HR 69 | Temp 97.7°F | Wt 191.5 lb

## 2018-05-10 DIAGNOSIS — N644 Mastodynia: Secondary | ICD-10-CM

## 2018-05-10 DIAGNOSIS — N6452 Nipple discharge: Secondary | ICD-10-CM | POA: Diagnosis not present

## 2018-05-10 NOTE — Patient Instructions (Signed)
Plan:  Labs today  Will check mammogram and ultrasound at [facility] in [place] will discuss results and next steps, if needed   Would keep appointment for genetics counselor to discuss testing   For breast tenderness: there is some evidence that OTC primrose oil supplements may help, caffeine avoidance may help.

## 2018-05-10 NOTE — Progress Notes (Signed)
HPI: Phyllis Glass is a 37 y.o. 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  she presents to [facility] today, 05/10/18,  for chief complaint of:  Breast tenderness  Concerns for FH breast cancer, see phone notes. Recently cancelled appointment with genetic counselor 05/07/18 when found out that they could not order mammograms. Reports breast tenderness (nothing of this was mentioned in notes from 03/2018 when she was asking for mammogram d/t family history).   Tenderness ongoing for about a month, associated w/ white watery nipple discharge on R last about 5 days ago, spontaneous.      At today's visit... Past medical history, surgical history, and family history reviewed and updated as needed.  Current medication list and allergy/intolerance information reviewed and updated as needed. (See remainder of HPI, ROS, Phys Exam below)           ASSESSMENT/PLAN: The primary encounter diagnosis was Breast tenderness in female. A diagnosis of Nipple discharge was also pertinent to this visit.  Benign exam except tenderness See pt instructions below.   Orders Placed This Encounter  Procedures  . MM Digital Diagnostic Bilat  . US BREAST COMPLETE UNI LEFT INC AXILLA  . US BREAST COMPLETE UNI RIGHT INC AXILLA  . TSH  . Prolactin     Patient Instructions  Plan:  Labs today  Will check mammogram and ultrasound at [facility] in [place] will discuss results and next steps, if needed   Would keep appointment for genetics counselor to discuss testing   For breast tenderness: there is some evidence that OTC primrose oil  supplements may help, caffeine avoidance may help.      Follow-up plan: Return if symptoms worsen or fail to improve. Otherwise annuacl wellness check-up due 08/2018 - 09/2018.                             ############################################ ############################################ ############################################ ############################################    Current Meds  Medication Sig  . ALPRAZolam (XANAX) 0.5 MG tablet Take 0.5-1 tablets (0.25-0.5 mg total) by mouth 2 (two) times daily as needed for anxiety (sparing use for severe symptoms).  Marland Kitchen FLUoxetine HCl 60 MG TABS TAKE 1 TABLET BY MOUTH DAILY    Allergies  Allergen Reactions  . Kiwi Extract Anaphylaxis  . Sulfa Antibiotics Anaphylaxis  . Gabapentin     weakness  . Ciprofloxacin Rash       Review of Systems:  Constitutional: No recent illness, no fever/chills  HEENT: No  headache, no vision change  Cardiac: No  chest pain  Respiratory:  No  shortness of breath. No  Cough  Musculoskeletal: No new myalgia/arthralgia  Skin: No  Rash  Hem/Onc: No  easy bruising/bleeding, No  abnormal lumps/bumps    Exam:  BP 124/71 (BP Location: Left Arm, Patient Position: Sitting, Cuff Size: Normal)   Pulse 69   Temp 97.7 F (36.5 C) (Oral)   Wt 191 lb 8 oz (86.9 kg)   BMI 35.03 kg/m   Constitutional: VS see above. General Appearance: alert, well-developed, well-nourished, NAD  Eyes: Normal lids and conjunctive, non-icteric sclera  Ears, Nose, Mouth, Throat: MMM, Normal external inspection ears/nares/mouth/lips/gums.  Neck: No masses, trachea midline.   Respiratory: Normal respiratory effort. no wheeze, no  rhonchi, no rales  Cardiovascular: S1/S2 normal, no murmur, no rub/gallop auscultated. RRR.   Neurological: Normal balance/coordination. No tremor.  Skin: warm, dry, intact.   Psychiatric: Normal judgment/insight. Normal mood and affect. Oriented x3.    BREAST: No rashes/skin changes, normal fibrous breast tissue, no masses, +tenderness R axilla and L 12:00 position, normal nipple without discharge, normal axilla to palpation       Visit summary with medication list and pertinent instructions was printed for patient to review, patient was advised to alert [place] if any updates are needed. All questions at time of visit were answered - patient instructed to contact office with any additional concerns. ER/RTC precautions were reviewed with the patient and understanding verbalized.     Please note: voice recognition software was used to produce this document, and typos may escape review. Please contact Dr. Sheppard Coil for any needed clarifications.    Follow up plan: Return if symptoms worsen or fail to improve. Otherwise annuacl wellness check-up due 08/2018 - 09/2018.

## 2018-05-11 ENCOUNTER — Ambulatory Visit: Payer: Managed Care, Other (non HMO) | Admitting: Physician Assistant

## 2018-05-11 LAB — PROLACTIN: PROLACTIN: 18.7 ng/mL

## 2018-05-11 LAB — TSH: TSH: 3.01 m[IU]/L

## 2018-05-21 ENCOUNTER — Other Ambulatory Visit: Payer: Self-pay | Admitting: Osteopathic Medicine

## 2018-05-22 ENCOUNTER — Ambulatory Visit
Admission: RE | Admit: 2018-05-22 | Discharge: 2018-05-22 | Disposition: A | Discharge status: Home / Self Care | Payer: Managed Care, Other (non HMO) | Source: Ambulatory Visit | Attending: Osteopathic Medicine | Admitting: Osteopathic Medicine

## 2018-05-22 ENCOUNTER — Ambulatory Visit: Payer: Managed Care, Other (non HMO)

## 2018-06-01 ENCOUNTER — Telehealth: Payer: Self-pay

## 2018-06-01 NOTE — Telephone Encounter (Signed)
Pt called stating she has a shingles outbreak on her arm. As per pt, she had shingles on her leg 5 yrs ago and symptoms are the same. She wants to know if provider can send in a rx to [facility] or does she need to make an appt for evaluation. Pls advise, thanks.

## 2018-06-04 MED ORDER — VALACYCLOVIR HCL 1 G PO TABS: 1000.0000 mg | ORAL_TABLET | Freq: Three times a day (TID) | ORAL | 0 refills | Status: AC

## 2018-06-04 NOTE — Telephone Encounter (Signed)
Attempted to contact patient. The call ends abruptly on pt's end. Unable to leave a vm msg.

## 2018-06-04 NOTE — Telephone Encounter (Signed)
Sorry for the late response, I was out of the office on Friday afternoon.  If she still needs the medicine, I sent some into pharmacy on file.

## 2018-12-08 ENCOUNTER — Other Ambulatory Visit: Payer: Self-pay | Admitting: Osteopathic Medicine

## 2018-12-08 NOTE — Telephone Encounter (Signed)
Forwarding medication refill request to PCP for review. 

## 2019-07-12 ENCOUNTER — Encounter: Payer: Managed Care, Other (non HMO) | Admitting: Nurse Practitioner

## 2019-07-12 IMAGING — US ULTRASOUND RIGHT BREAST LIMITED
1 series · 8 of 8 positions shown · non-contrast
Comparison: None.

CLINICAL DATA: 37-year-old female presenting for baseline exam due
to tenderness in the lateral right breast as well as right nipple
discharge which has occurred 1-2 times. This was last noticed 3
weeks ago. The color is not known. She also has a sensation of a
knot in the lateral right breast without discrete palpable lump
which has significantly improved.

EXAM:
DIGITAL DIAGNOSTIC BILATERAL MAMMOGRAM WITH CAD AND TOMO
ULTRASOUND RIGHT BREAST

[Series 1: ultrasound right breast limited · 0.07mm/px · 8 of 8 slices shown]
[im 1/8]
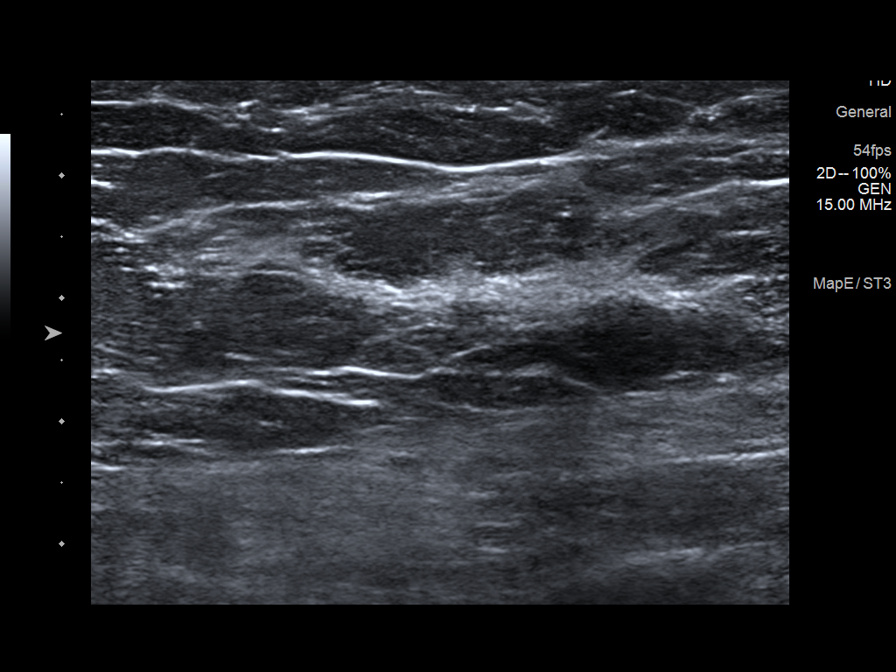
[im 2/8]
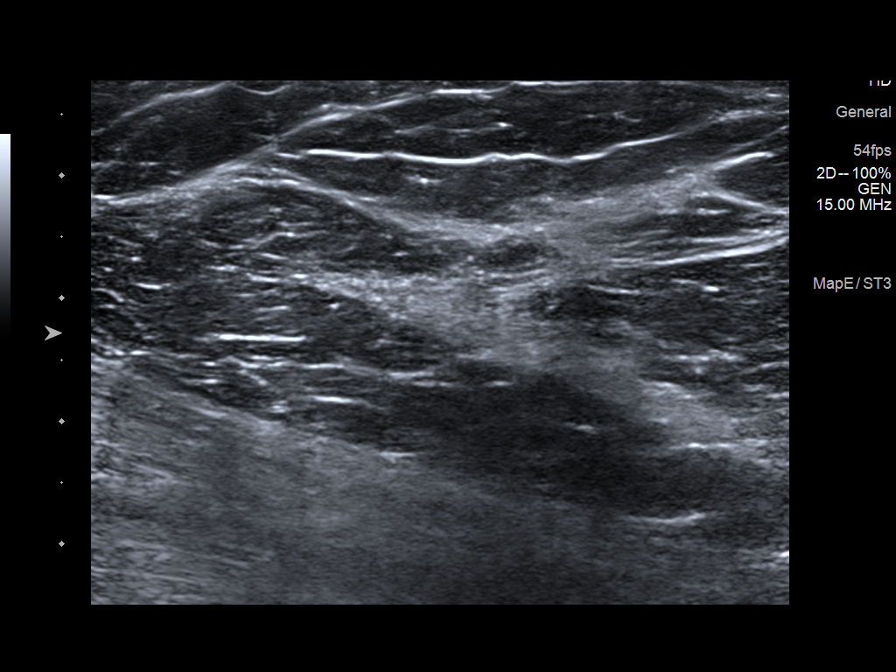
[im 3/8]
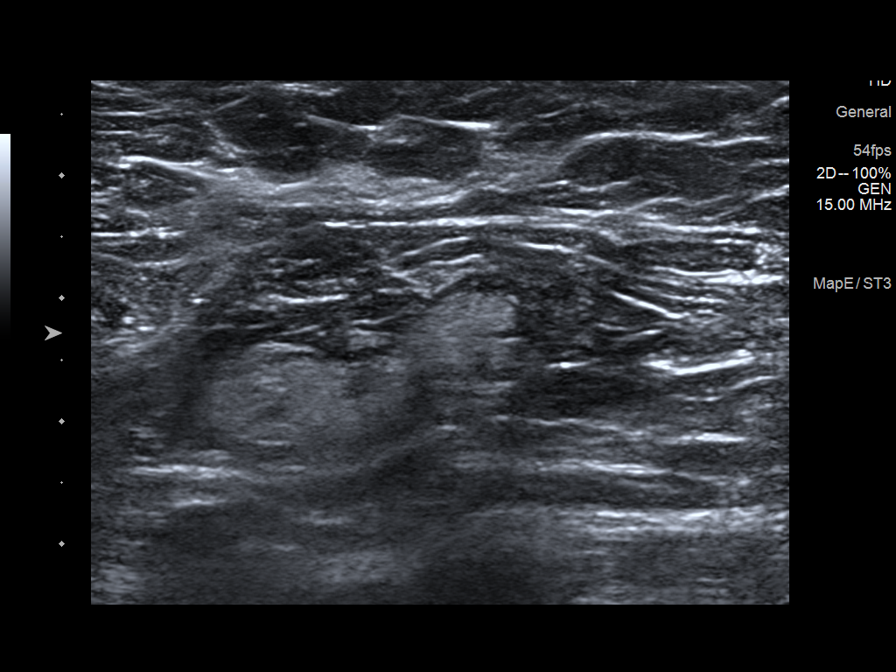
[im 4/8]
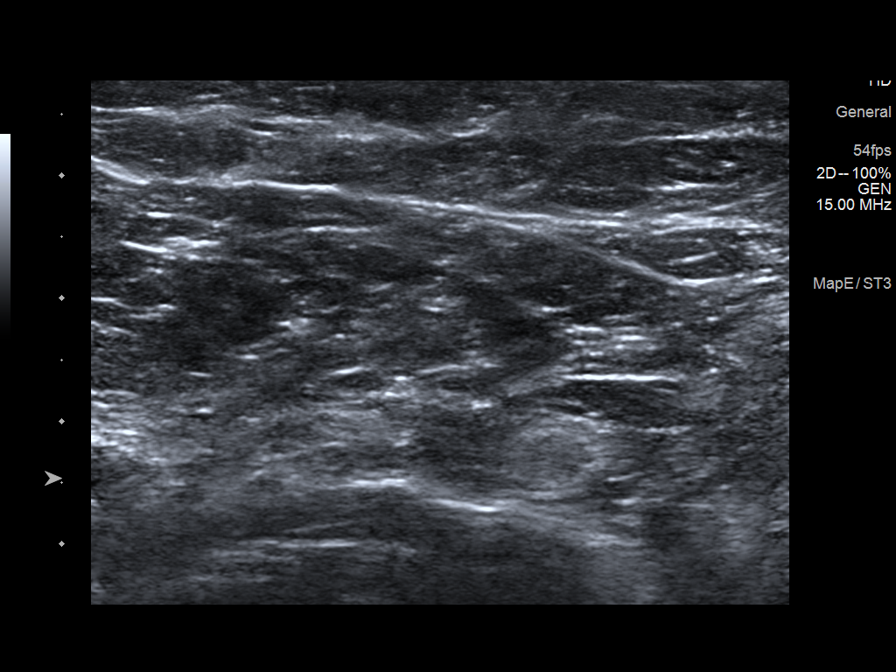
[im 5/8]
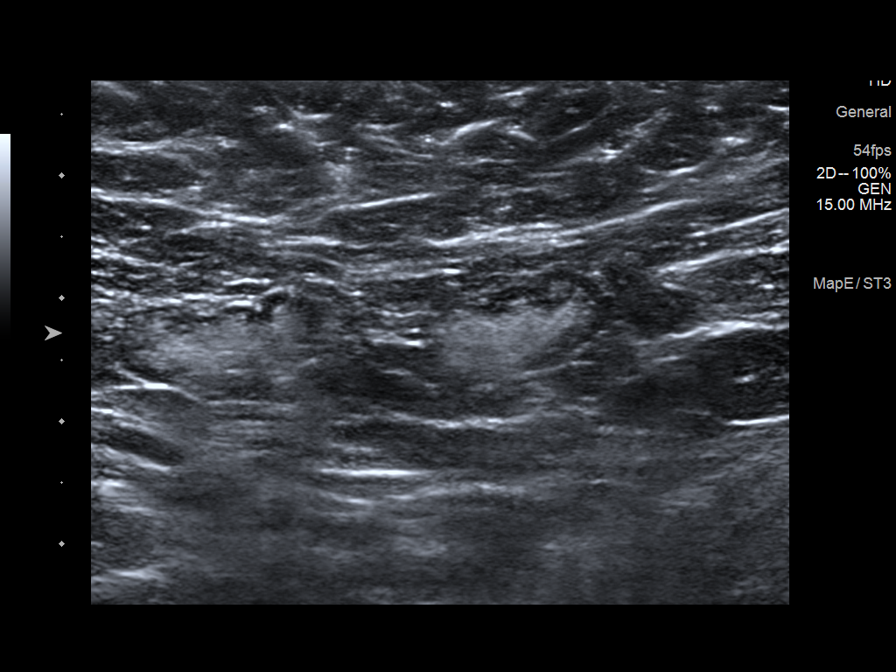
[im 6/8]
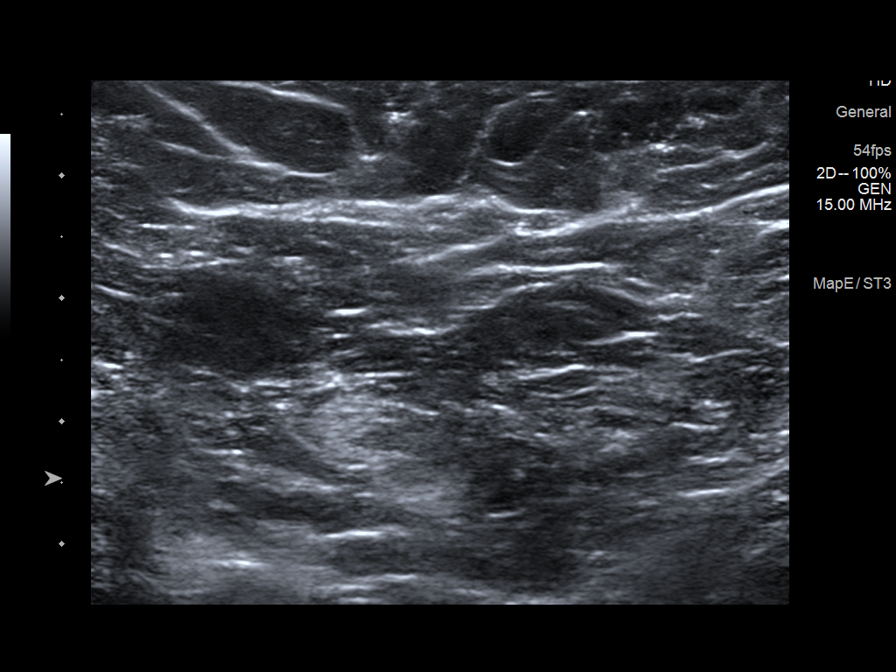
[im 7/8]
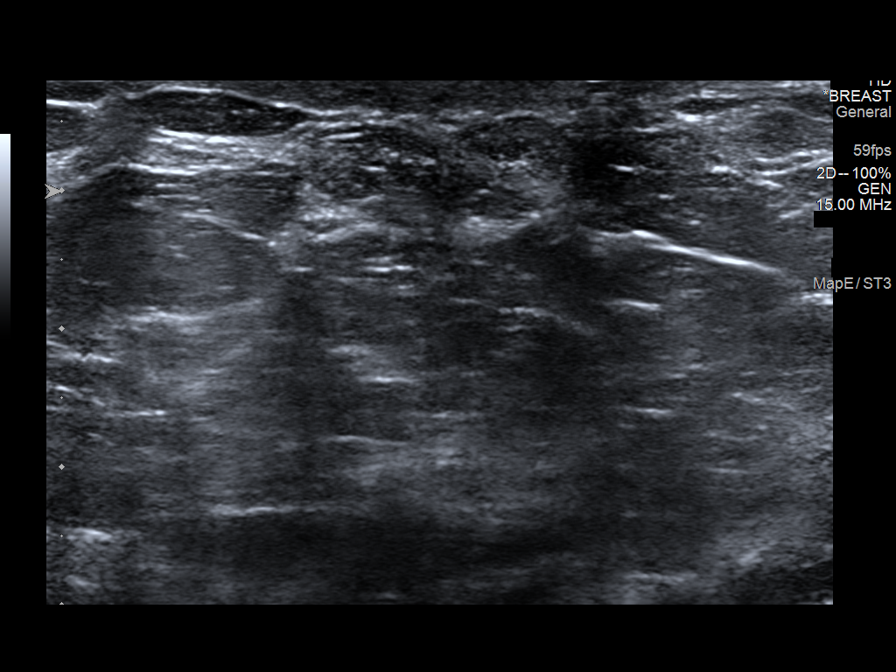
[im 8/8]
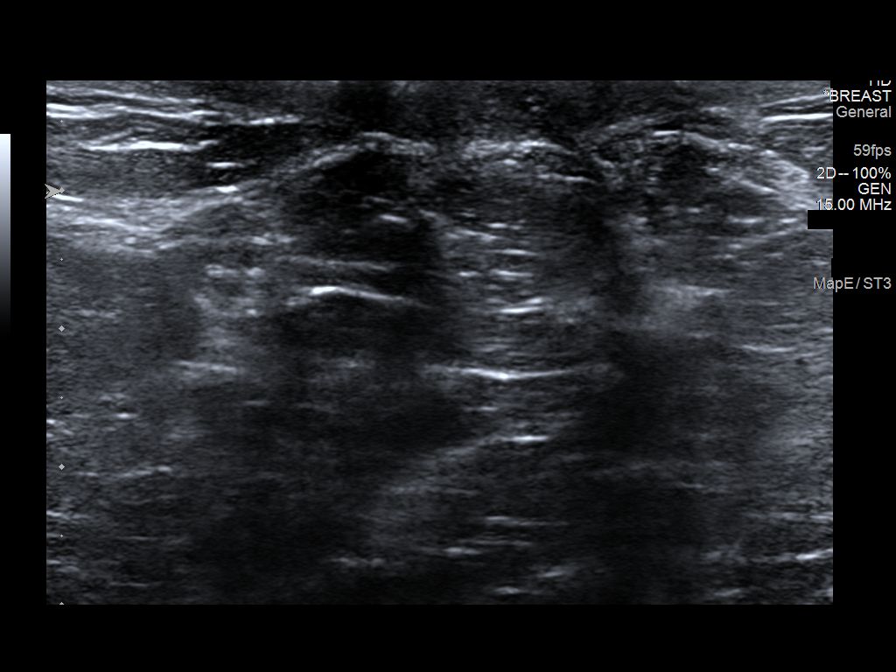

[8 of 8 positions shown; findings below may reference images not displayed]

ACR Breast Density Category b: There are scattered areas of
fibroglandular density.
FINDINGS: No suspicious calcifications, masses or areas of distortion are seen
in the bilateral breasts.

Mammographic images were processed with CAD.

On physical exam, no suspicious palpable masses are identified in
the upper-outer quadrant of the right breast or in the axilla at the
site of persistent pain.

Ultrasound of the upper-outer quadrant of the right breast
demonstrates normal fibroglandular tissue. No masses or suspicious
areas of shadowing are identified. Ultrasound of the right axilla
demonstrates multiple normal-appearing lymph nodes. Ultrasound of
the retroareolar right breast demonstrates normal tissue. No dilated
ducts or intraductal masses are found.
IMPRESSION: 1. There are no mammographic or targeted sonographic findings in the
right breast to explain the patient's previous lump or pain, or her
resolved right nipple discharge.

2. No mammographic evidence of malignancy in the bilateral breasts.

RECOMMENDATION:
1. Clinical follow-up recommended for the pain and discharge in the
right breast. Any further workup should be based on clinical
grounds.

2. Screening mammogram at age 40 unless there are persistent or
intervening clinical concerns. (Code:V2-4-ZDJ)

I have discussed the findings and recommendations with the patient.
Results were also provided in writing at the conclusion of the
visit. If applicable, a reminder letter will be sent to the patient
regarding the next appointment.

BI-RADS CATEGORY  1: Negative.

## 2019-07-12 IMAGING — MG DIGITAL DIAGNOSTIC BILATERAL MAMMOGRAM WITH TOMO AND CAD
8 series · 8 of 24 positions shown · non-contrast
Comparison: None.

CLINICAL DATA: 37-year-old female presenting for baseline exam due
to tenderness in the lateral right breast as well as right nipple
discharge which has occurred 1-2 times. This was last noticed 3
weeks ago. The color is not known. She also has a sensation of a
knot in the lateral right breast without discrete palpable lump
which has significantly improved.

EXAM:
DIGITAL DIAGNOSTIC BILATERAL MAMMOGRAM WITH CAD AND TOMO
ULTRASOUND RIGHT BREAST

[L MLO synth-2D]
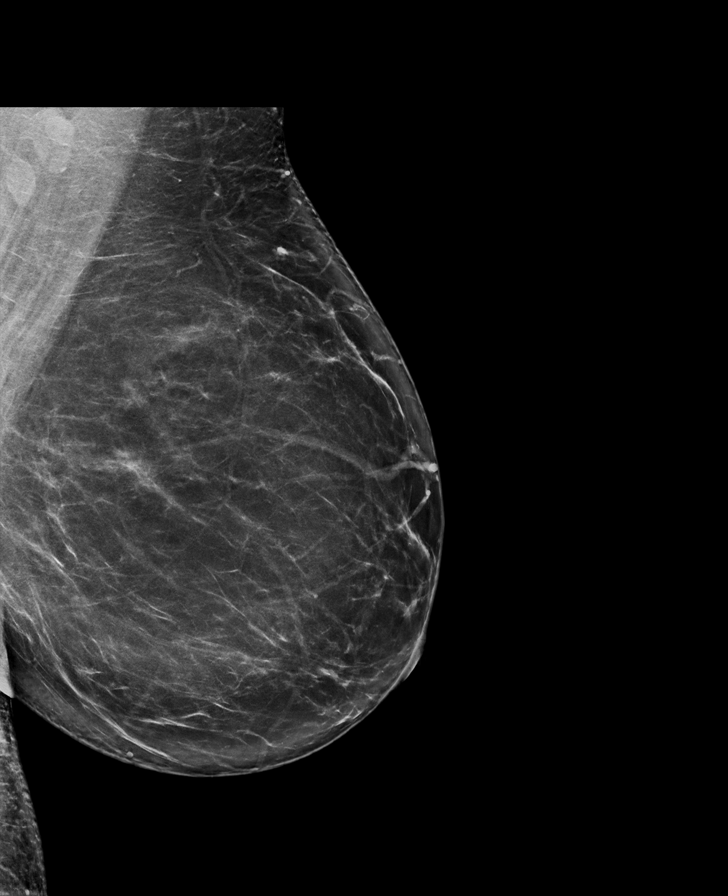

[R CC synth-2D]
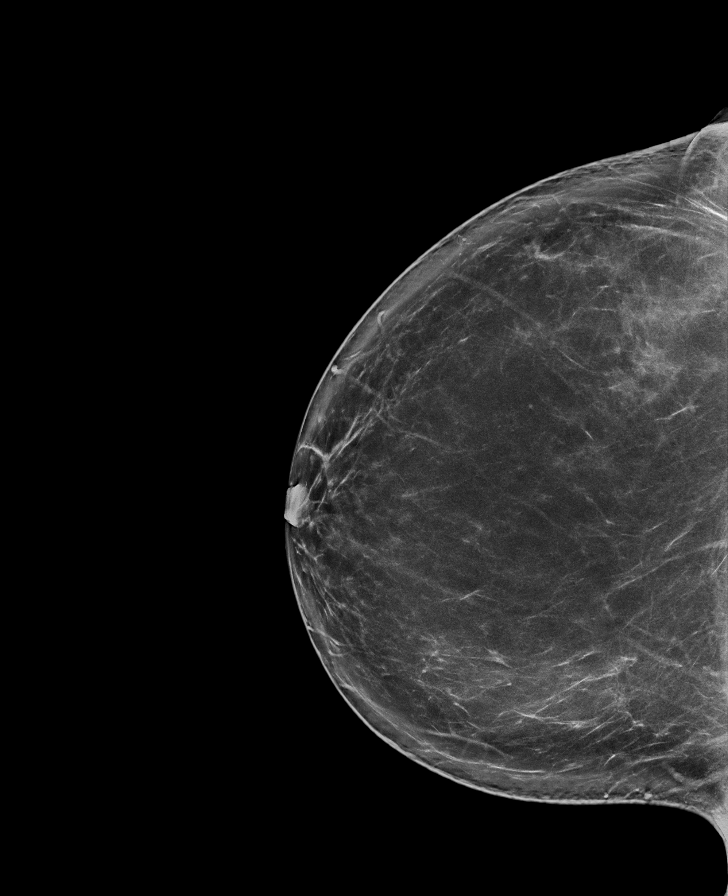

[R MLO synth-2D]
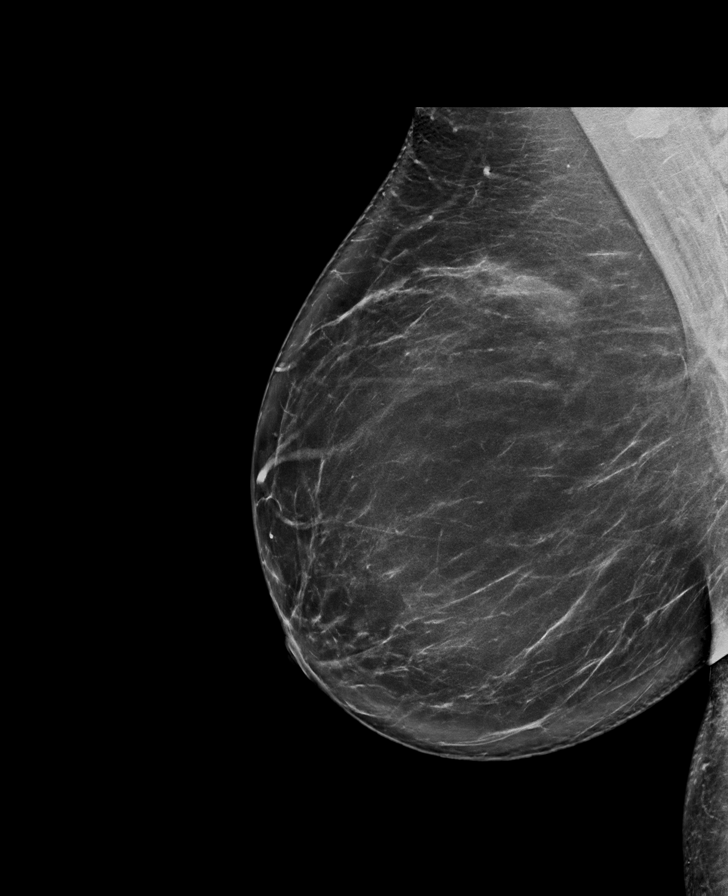

[L CC synth-2D]
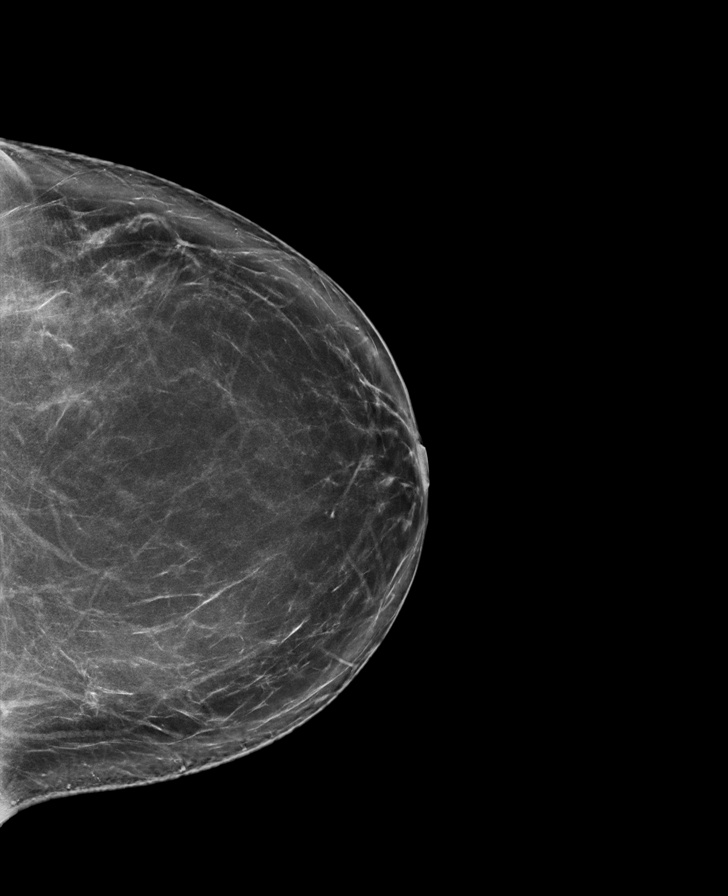

[R MLO tomo · tomo slice 52/103.0]
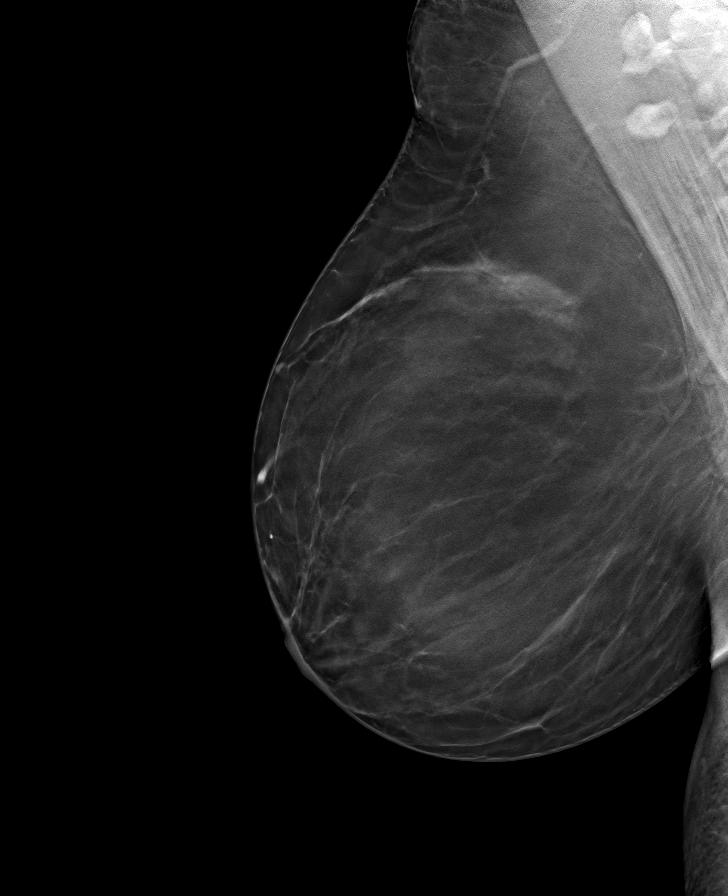

[L CC tomo · tomo slice 49/96.0]
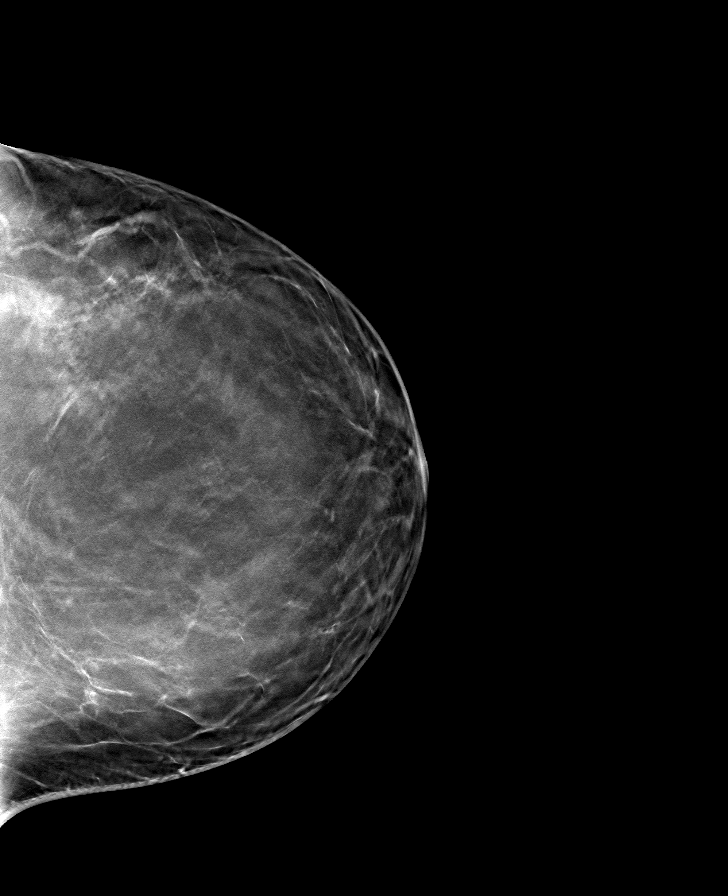

[R CC tomo · tomo slice 49/96.0]
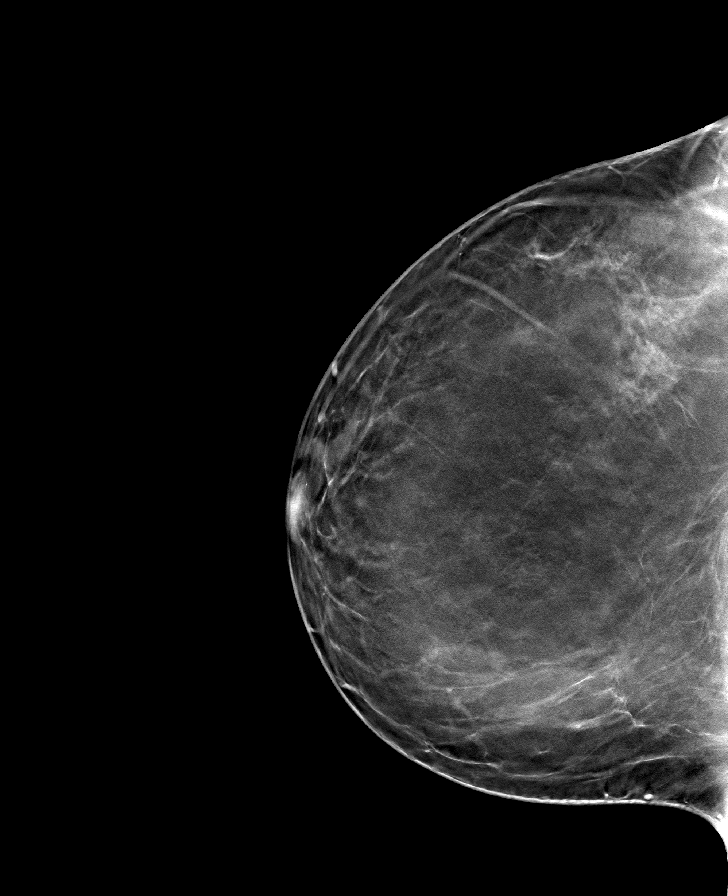

[L MLO tomo · tomo slice 49/98.0]
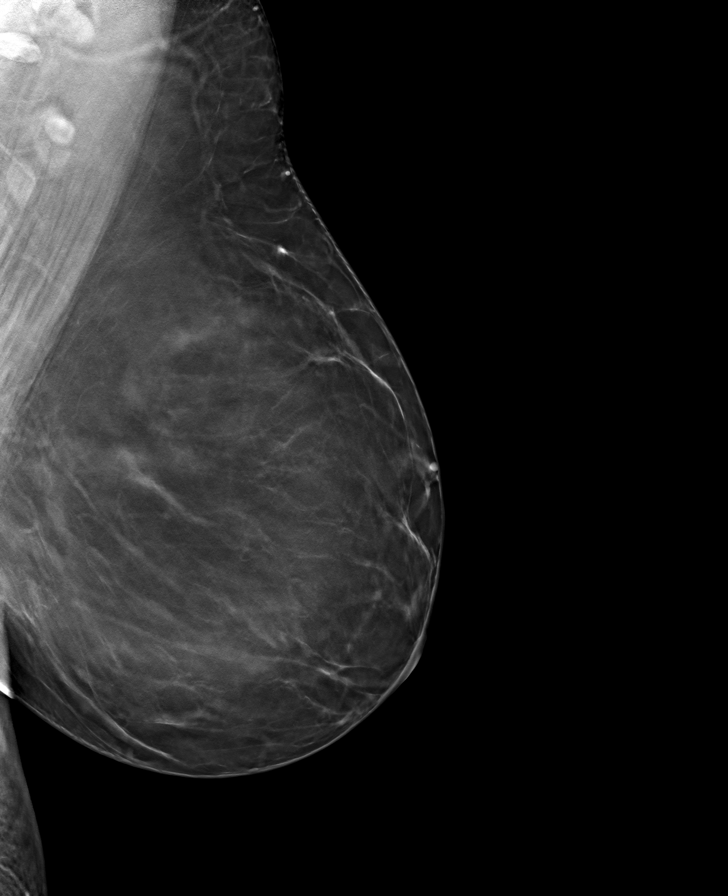

[8 of 24 positions shown; findings below may reference images not displayed]

ACR Breast Density Category b: There are scattered areas of
fibroglandular density.
FINDINGS: No suspicious calcifications, masses or areas of distortion are seen
in the bilateral breasts.

Mammographic images were processed with CAD.

On physical exam, no suspicious palpable masses are identified in
the upper-outer quadrant of the right breast or in the axilla at the
site of persistent pain.

Ultrasound of the upper-outer quadrant of the right breast
demonstrates normal fibroglandular tissue. No masses or suspicious
areas of shadowing are identified. Ultrasound of the right axilla
demonstrates multiple normal-appearing lymph nodes. Ultrasound of
the retroareolar right breast demonstrates normal tissue. No dilated
ducts or intraductal masses are found.
IMPRESSION: 1. There are no mammographic or targeted sonographic findings in the
right breast to explain the patient's previous lump or pain, or her
resolved right nipple discharge.

2. No mammographic evidence of malignancy in the bilateral breasts.

RECOMMENDATION:
1. Clinical follow-up recommended for the pain and discharge in the
right breast. Any further workup should be based on clinical
grounds.

2. Screening mammogram at age 40 unless there are persistent or
intervening clinical concerns. (Code:[id number]-ZDJ)

I have discussed the findings and recommendations with the patient.
Results were also provided in writing at the conclusion of the
visit. If applicable, a reminder letter will be sent to the patient
regarding the next appointment.

BI-RADS CATEGORY  1: Negative.

## 2019-07-18 ENCOUNTER — Other Ambulatory Visit: Payer: Self-pay

## 2019-07-18 MED ORDER — FLUOXETINE HCL 60 MG PO TABS: 1.0000 | ORAL_TABLET | Freq: Every day | ORAL | 0 refills | Status: DC

## 2019-08-19 ENCOUNTER — Other Ambulatory Visit: Payer: Self-pay

## 2019-08-19 MED ORDER — FLUOXETINE HCL 60 MG PO TABS: 1.0000 | ORAL_TABLET | Freq: Every day | ORAL | 0 refills | Status: DC

## 2019-08-21 ENCOUNTER — Ambulatory Visit: Payer: Managed Care, Other (non HMO) | Admitting: Osteopathic Medicine

## 2019-08-21 ENCOUNTER — Other Ambulatory Visit: Payer: Self-pay

## 2019-08-21 ENCOUNTER — Encounter: Payer: Self-pay | Admitting: Osteopathic Medicine

## 2019-08-21 VITALS — BP 134/86 | HR 76 | Temp 98.7°F | Wt 189.0 lb

## 2019-08-21 DIAGNOSIS — Z1231 Encounter for screening mammogram for malignant neoplasm of breast: Secondary | ICD-10-CM | POA: Diagnosis not present

## 2019-08-21 DIAGNOSIS — F3342 Major depressive disorder, recurrent, in full remission: Secondary | ICD-10-CM

## 2019-08-21 DIAGNOSIS — Z0000 Encounter for general adult medical examination without abnormal findings: Secondary | ICD-10-CM

## 2019-08-21 MED ORDER — FLUOXETINE HCL 60 MG PO TABS: 1.0000 | ORAL_TABLET | Freq: Every day | ORAL | 3 refills | Status: DC

## 2019-08-21 NOTE — Progress Notes (Signed)
Phyllis Glass is a 39 y.o. female who presents to  [facility]  today, 08/21/19, seeking care for the following: . Annual Check-up . Medication refills      ASSESSMENT & PLAN with other pertinent history/findings:  The primary encounter diagnosis was Annual physical exam. Diagnoses of Breast cancer screening by mammogram and Recurrent major depressive disorder, in full remission [facility]) were also pertinent to this visit.   Orders Placed This Encounter  Procedures  . MM 3D SCREEN BREAST BILATERAL  . CBC  . COMPLETE METABOLIC PANEL WITH GFR  . Lipid panel   Meds ordered this encounter  Medications  . FLUoxetine HCl 60 MG TABS    Sig: Take 1 tablet by mouth daily.    Dispense:  90 tablet    Refill:  3   General Preventive Care  Most recent routine screening labs: ordered - please return fasting for cholesterol and sugars screening.   Tobacco: don't!   Alcohol: responsible moderation is ok for most adults - if you have concerns about your alcohol intake, please talk to me!   Exercise: as tolerated to reduce risk of cardiovascular disease and diabetes. Strength training will also prevent osteoporosis.   Mental health: if need for mental health care (medicines, counseling, other), or concerns about moods, please let me know!   Sexual health: if need for STD testing, or if concerns with libido/pain problems, please let me know! (relies on BTL for contraception)  Advanced Directive: Living Will and/or [place] recommended for all adults, regardless of age or health.  Vaccines  Flu vaccine: for almost everyone, every fall.   Shingles vaccine: after age 32.   Pneumonia vaccines: after age 35  Tetanus booster: every 10 years - due 2029  COVID vaccine: as soon as you're eligible! Please follow [email or web address] for updates on eligibility and vaccination appointment. As of now, we do not anticipate  our clinic having vaccines anytime soon.  Cancer screenings   Colon cancer screening: for everyone age 69-75.   Breast cancer screening: mammogram at age 22 every other year at least, and annually after age 41.   Cervical cancer screening: Pap due 01/2020  Lung cancer screening: not needed for non-smokers  Infection screenings  . HIV: recommended screening at least once age 4-65 . Gonorrhea/Chlamydia: screening as needed . Hepatitis C: recommended once for everyone age 30-75 . TB: certain at-risk populations, or depending on work requirements and/or travel history Other  Bone Density Test: recommended for women at age 71    Physical exam:  Constitutional:  . VSS, see nurse notes . General Appearance: alert, well-developed, well-nourished, NAD Eyes: Marland Kitchen Normal lids and conjunctive, non-icteric sclera . PERRLA Ears, Nose, Mouth, Throat: . Normal external auditory canal and TM bilaterally Neck: . No masses, trachea midline . No thyroid enlargement/tenderness/mass appreciated Respiratory: . Normal respiratory effort . Breath sounds normal, no wheeze/rhonchi/rales Cardiovascular: . S1/S2 normal, no murmur/rub/gallop auscultated . No lower extremity edema Gastrointestinal: . Nontender, no masses . No hepatomegaly, no splenomegaly . No hernia appreciated Musculoskeletal:  . Gait normal . No clubbing/cyanosis of digits Neurological: . No cranial nerve deficit on limited exam . Motor and sensation intact and symmetric Psychiatric: . Normal judgment/insight . Normal mood and affect    Follow-up instructions: Return in about 1 year (around 08/20/2020) for ANNUAL AND PAP (call week prior to visit for lab orders). Lipid, CBC, CMP  BP 134/86 (BP Location: Left Arm, Patient Position: Sitting, Cuff Size: Normal)   Pulse 76   Temp 98.7 F (37.1 C) (Oral)   Wt 189 lb 0.6 oz (85.7 kg)   BMI 34.58  kg/m   Current Meds  Medication Sig  . FLUoxetine HCl 60 MG TABS Take 1 tablet by mouth daily.  . [DISCONTINUED] FLUoxetine HCl 60 MG TABS Take 1 tablet by mouth daily.    No results found for this or any previous visit (from the past 72 hour(s)).  No results found.  Depression screen [facility] 2/9 10/17/2017 09/12/2017 08/11/2016  Decreased Interest 2 0 -  Down, Depressed, Hopeless 2 1 -  PHQ - 2 Score 4 1 -  Altered sleeping 2 0 3  Tired, decreased energy 3 0 3  Change in appetite 3 2 3   Feeling bad or failure about yourself  3 0 1  Trouble concentrating 3 0 1  Moving slowly or fidgety/restless 2 0 0  Suicidal thoughts 0 0 0  PHQ-9 Score 20 3 -  Difficult doing work/chores Very difficult Not difficult at all -    GAD 7 : Generalized Anxiety Score 10/17/2017 09/12/2017 08/11/2016  Nervous, Anxious, on Edge 3 2 2   Control/stop worrying 2 0 2  Worry too much - different things 2 0 2  Trouble relaxing 3 3 3   Restless 2 0 3  Easily annoyed or irritable 3 1 2   Afraid - awful might happen 3 0 0  Total GAD 7 Score 18 6 14   Anxiety Difficulty Very difficult Somewhat difficult -      All questions at time of visit were answered - patient instructed to contact office with any additional concerns or updates.  ER/RTC precautions were reviewed with the patient.  Please note: voice recognition software was used to produce this document, and typos may escape review. Please contact Dr. Sheppard Coil for any needed clarifications.

## 2019-08-21 NOTE — Patient Instructions (Addendum)
General Preventive Care  Most recent routine screening labs: ordered - please return fasting for cholesterol and sugars screening.   Tobacco: don't!   Alcohol: responsible moderation is ok for most adults - if you have concerns about your alcohol intake, please talk to me!   Exercise: as tolerated to reduce risk of cardiovascular disease and diabetes. Strength training will also prevent osteoporosis.   Mental health: if need for mental health care (medicines, counseling, other), or concerns about moods, please let me know!   Sexual health: if need for STD testing, or if concerns with libido/pain problems, please let me know!   Advanced Directive: Living Will and/or Healthcare Power of Attorney recommended for all adults, regardless of age or health.  Vaccines  Flu vaccine: for almost everyone, every fall.   Shingles vaccine: after age 70.   Pneumonia vaccines: after age 54  Tetanus booster: every 10 years - due 2029  COVID vaccine: as soon as you're eligible! Please follow [email or web address] for updates on eligibility and vaccination appointment. As of now, we do not anticipate our clinic having vaccines anytime soon.  Cancer screenings   Colon cancer screening: for everyone age 52-75.   Breast cancer screening: mammogram at age 66 every other year at least, and annually after age 20.   Cervical cancer screening: Pap due 01/2020 - ok to hold off until annual next year since you're low risk   Lung cancer screening: not needed for non-smokers  Infection screenings  . HIV: recommended screening at least once age 28-65 . Gonorrhea/Chlamydia: screening as needed . Hepatitis C: recommended once for everyone age 11-75 . TB: certain at-risk populations, or depending on work requirements and/or travel history Other  Bone Density Test: recommended for women at age 67

## 2020-05-30 ENCOUNTER — Emergency Department
Admission: EM | Admit: 2020-05-30 | Discharge: 2020-05-30 | Disposition: A | Discharge status: Home / Self Care | Payer: Managed Care, Other (non HMO) | Source: Home / Self Care

## 2020-05-30 ENCOUNTER — Other Ambulatory Visit: Payer: Self-pay

## 2020-05-30 ENCOUNTER — Emergency Department: Admit: 2020-05-30 | Discharge status: Against Medical Advice | Payer: Self-pay

## 2020-05-30 DIAGNOSIS — R300 Dysuria: Secondary | ICD-10-CM | POA: Diagnosis not present

## 2020-05-30 DIAGNOSIS — N3001 Acute cystitis with hematuria: Secondary | ICD-10-CM

## 2020-05-30 LAB — POCT URINALYSIS DIP (MANUAL ENTRY)
Bilirubin, UA: NEGATIVE
Glucose, UA: NEGATIVE mg/dL
Nitrite, UA: POSITIVE — AB
Protein Ur, POC: >=300 mg/dL — AB
Spec Grav, UA: 1.02 (ref 1.010–1.025)
Urobilinogen, UA: 0.2 E.U./dL
pH, UA: 6 (ref 5.0–8.0)

## 2020-05-30 MED ORDER — NITROFURANTOIN MONOHYD MACRO 100 MG PO CAPS: 100.0000 mg | ORAL_CAPSULE | Freq: Two times a day (BID) | ORAL | 0 refills | Status: AC

## 2020-05-30 NOTE — Discharge Instructions (Signed)
You may continue Azo's for urinary pain and discomfort.  Start Macrobid 100 mg twice daily with food for total 7 days.  Your urine culture is pending if any antibiotics requires changing we will notify you by phone.  Hydrate well with water avoid any caffeinated beverages as this could cause symptoms to worsen and increased urinary frequency.

## 2020-05-30 NOTE — ED Provider Notes (Signed)
[facility]    CSN: [id number] Arrival date & time: 05/30/20  1583      History   Chief Complaint Chief Complaint  Patient presents with  . Dysuria    HPI Phyllis Glass is a 40 y.o. female.   HPI  Patient presents today with dysuria and urine frequency ongoing since yesterday.  Patient reports as the day progressed her symptoms worsened.  She took 1 dose of Azo last night for urinary discomfort.  She remains symptomatic today and presents today to rule out a urinary tract infection.  She is afebrile denies any nausea vomiting or severe flank or abdominal pain. Patient's last menstrual period was 05/11/2020.   Past Medical History:  Diagnosis Date  . Allergic rhinitis 03/06/2014  . Bell palsy 03/06/2014  . Carpal tunnel syndrome 03/06/2014  . Depression   . DUB (dysfunctional uterine bleeding) 03/06/2014  . Encounter for assisted reproductive fertility procedure cycle 02/03/2014  . Foot sprain 03/06/2014  . Neurocardiogenic syncope 12/22/2014   Overview:  DURING MEDICAL PROCEDURES PER PATIENT REPORT     Patient Active Problem List   Diagnosis Date Noted  . Neurocardiogenic syncope 12/22/2014  . Allergic rhinitis 03/06/2014  . Anxiety disorder 03/06/2014  . Bell palsy 03/06/2014  . Carpal tunnel syndrome 03/06/2014  . Recurrent depressive disorder (HCC) 03/06/2014  . DUB (dysfunctional uterine bleeding) 03/06/2014  . Fatigue 03/06/2014  . Keratosis pilaris 03/06/2014  . 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 Encounter for assisted reproductive fertility procedure cycle 02/03/2014    Past Surgical History:  Procedure Laterality Date  . CESAREAN SECTION     [phone]  . FACIAL RECONSTRUCTION SURGERY  2011   For attempted nerve damage repair Bell's palsy    OB History   No obstetric history on file.      Home Medications    Prior to Admission medications   Medication Sig Start Date End Date Taking? Authorizing Provider   ALPRAZolam Prudy Feeler) 0.5 MG tablet Take 0.5-1 tablets (0.25-0.5 mg total) by mouth 2 (two) times daily as needed for anxiety (sparing use for severe symptoms). Patient not taking: No sig reported 10/23/17   Sunnie Nielsen, DO  FLUoxetine HCl 60 MG TABS Take 1 tablet by mouth daily. 08/21/19   Sunnie Nielsen, DO    Family History Family History  Problem Relation Age of Onset  . Hyperlipidemia Father   . Hypertension Father   . Heart attack Father        1st at age 82  . Alcohol abuse Maternal Grandfather   . Diabetes Paternal Grandmother   . Stomach cancer Paternal Grandmother        age 22  . Breast cancer Paternal Grandmother        age 81  . Heart attack Maternal Aunt   . Heart attack Maternal Uncle   . Heart attack Paternal Grandfather   . Melanoma Paternal Grandfather        age 35  . Breast cancer Other        age 75  . Breast cancer Paternal Aunt        age 40  . Healthy Mother     Social History Social History   Tobacco Use  . Smoking status: Never Smoker  . Smokeless tobacco: Never Used  Vaping Use  . Vaping Use: Never used  Substance Use Topics  . Alcohol use: Yes    Alcohol/week: 0.0 standard drinks    Comment: 1-2 per week  . Drug  use: Never     Allergies   Kiwi extract, Sulfa antibiotics, Gabapentin, and Ciprofloxacin   Review of Systems Review of Systems Pertinent negatives listed in HPI Physical Exam Triage Vital Signs ED Triage Vitals  Enc Vitals Group     BP 05/30/20 0941 127/72     Pulse Rate 05/30/20 0941 68     Resp 05/30/20 0941 18     Temp 05/30/20 0941 97.6 F (36.4 C)     Temp Source 05/30/20 0941 Oral     SpO2 05/30/20 0941 99 %     Weight 05/30/20 0938 180 lb (81.6 kg)     Height 05/30/20 0938 5\' 2"  (1.575 m)     Head Circumference --      Peak Flow --      Pain Score 05/30/20 0938 4     Pain Loc --      Pain Edu? --      Excl. in [place]? --    No data found.  Updated Vital Signs BP 127/72 (BP Location: Right Arm)    Pulse 68   Temp 97.6 F (36.4 C) (Oral)   Resp 18   Ht 5\' 2"  (1.575 m)   Wt 180 lb (81.6 kg)   LMP 05/11/2020   SpO2 99%   BMI 32.92 kg/m   Visual Acuity Right Eye Distance:   Left Eye Distance:   Bilateral Distance:    Right Eye Near:   Left Eye Near:    Bilateral Near:     Physical Exam General appearance: alert, well developed, well nourished, cooperative  Head: Normocephalic, without obvious abnormality, atraumatic Respiratory: Respirations even and unlabored, normal respiratory rate Heart: rate and rhythm normal. No gallop or murmurs noted on exam  Abdomen: BS +, no distention, no rebound tenderness, or no mass Extremities: No gross deformities Skin: Skin color, texture, turgor normal. No rashes seen  Psych: Appropriate mood and affect. Neurologic: Mental status: Alert, oriented to person, place, and time, thought content appropriate.  UC Treatments / Results  Labs (all labs ordered are listed, but only abnormal results are displayed) Labs Reviewed  POCT URINALYSIS DIP (MANUAL ENTRY) - Abnormal; Notable for the following components:      Result Value   Color, UA straw (*)    Clarity, UA cloudy (*)    Ketones, POC UA trace (5) (*)    Blood, UA large (*)    Protein Ur, POC >=300 (*)    Nitrite, UA Positive (*)    Leukocytes, UA Large (3+) (*)    All other components within normal limits  URINE CULTURE    EKG   Radiology No results found.  Procedures Procedures (including critical care time)  Medications Ordered in UC Medications - No data to display  Initial Impression / Assessment and Plan / UC Course  I have reviewed the triage vital signs and the nursing notes.  Pertinent labs & imaging results that were available during my care of the patient were reviewed by me and considered in my medical decision making (see chart for details).      UA abnormal and findings consistent with UTI. Empiric antibiotic treatment initiated. Encouraged increase  intake of water. Recommended cranberry tablets, wiping from front to back, and urinating prior to and after cortis to reduce risk for reinfection. Urine culture pending. ER if symptoms become severe. Follow-up with PCP if symptoms do not completely resolve.  Final Clinical Impressions(s) / UC Diagnoses   Final diagnoses:  Acute cystitis  with hematuria     Discharge Instructions     You may continue Azo's for urinary pain and discomfort.  Start Macrobid 100 mg twice daily with food for total 7 days.  Your urine culture is pending if any antibiotics requires changing we will notify you by phone.  Hydrate well with water avoid any caffeinated beverages as this could cause symptoms to worsen and increased urinary frequency.    ED Prescriptions    Medication Sig Dispense Auth. Provider   nitrofurantoin, macrocrystal-monohydrate, (MACROBID) 100 MG capsule Take 1 capsule (100 mg total) by mouth 2 (two) times daily for 7 days. 14 capsule Scot Jun, FNP     PDMP not reviewed this encounter.   Scot Jun, FNP 05/30/20 1022

## 2020-05-30 NOTE — ED Triage Notes (Signed)
Pt presents to Urgent Care with c/o dysuria and urinary frequency x 1 day.

## 2020-06-01 LAB — URINE CULTURE
MICRO NUMBER:: 11424343
SPECIMEN QUALITY:: ADEQUATE

## 2020-06-02 ENCOUNTER — Telehealth (HOSPITAL_COMMUNITY): Payer: Self-pay | Admitting: Emergency Medicine

## 2020-06-02 MED ORDER — CEPHALEXIN 500 MG PO CAPS: 500.0000 mg | ORAL_CAPSULE | Freq: Two times a day (BID) | ORAL | 0 refills | Status: AC

## 2020-09-08 ENCOUNTER — Other Ambulatory Visit: Payer: Self-pay

## 2020-09-08 MED ORDER — FLUOXETINE HCL 60 MG PO TABS: 1.0000 | ORAL_TABLET | Freq: Every day | ORAL | 0 refills | Status: DC

## 2020-12-14 ENCOUNTER — Other Ambulatory Visit: Payer: Self-pay | Admitting: Osteopathic Medicine

## 2020-12-21 ENCOUNTER — Telehealth: Payer: Self-pay | Admitting: General Practice

## 2020-12-21 NOTE — Telephone Encounter (Signed)
Transition Care Management Unsuccessful Follow-up Telephone Call  Date of discharge and from where:  12/21/20 from [facility]  Attempts:  1st Attempt  Reason for unsuccessful TCM follow-up call:  Left voice message

## 2020-12-23 ENCOUNTER — Other Ambulatory Visit: Payer: Self-pay

## 2020-12-23 ENCOUNTER — Ambulatory Visit: Payer: Managed Care, Other (non HMO) | Admitting: Osteopathic Medicine

## 2020-12-23 VITALS — BP 131/71 | HR 75 | Temp 97.9°F | Wt 192.0 lb

## 2020-12-23 DIAGNOSIS — G8929 Other chronic pain: Secondary | ICD-10-CM

## 2020-12-23 DIAGNOSIS — Z8669 Personal history of other diseases of the nervous system and sense organs: Secondary | ICD-10-CM

## 2020-12-23 DIAGNOSIS — R531 Weakness: Secondary | ICD-10-CM

## 2020-12-23 DIAGNOSIS — R0789 Other chest pain: Secondary | ICD-10-CM | POA: Diagnosis not present

## 2020-12-23 DIAGNOSIS — R202 Paresthesia of skin: Secondary | ICD-10-CM | POA: Diagnosis not present

## 2020-12-23 DIAGNOSIS — R55 Syncope and collapse: Secondary | ICD-10-CM | POA: Diagnosis not present

## 2020-12-23 DIAGNOSIS — R519 Headache, unspecified: Secondary | ICD-10-CM

## 2020-12-23 MED ORDER — FLUOXETINE HCL 60 MG PO TABS: 1.0000 | ORAL_TABLET | Freq: Every day | ORAL | 3 refills | Status: DC

## 2020-12-23 NOTE — Patient Instructions (Addendum)
Plan: Checking B12 ans thyroid levels  Referring to neurology to evaluate the weakness.  Refer for stress test (may need visit w/ cardiology, will let you know!).  You should get a call about the neuro referral and stress test

## 2020-12-23 NOTE — Progress Notes (Signed)
Phyllis Glass is a 40 y.o. female who presents to  [place] at [facility]  today, 12/23/20, seeking care for the following:  Hospital follow-up - ER visit 3 days ago Cc: Substernal CP & SOB <1 day, fatigue, L arm and leg numbness/paresthesia x1 year but worse PTA in ER (hx Bell's Palsy 16 years ago), L eye pain w/ headache intermittent >1 year but worse PTA in ER CT head, CXR, EKG, Troponins and other labs ok  Recommended PCP f/u consider Zio and stress test and consider neuro w/u if cardiac w/u WNL   Today reports: doing better, CP/SOB more related to feeling like she couldn't get a deep breath, reports pre-syncopal event at church prior to presenting to ED, L sided numbness/tingling is bettter sine few days ago but still bothering her, reports numbenss/tingling in hands (a bit more "sore" compared to previous carpal tunnel diagnoses, worse on L at thsi time), also bilateral tingling in legs.    ASSESSMENT & PLAN with other pertinent findings:  The primary encounter diagnosis was Paresthesia. Diagnoses of Neurocardiogenic syncope, Atypical chest pain, Chronic nonintractable headache, unspecified headache type, Left-sided weakness, History of Bell's palsy, and History of carpal tunnel syndrome were also pertinent to this visit.    Patient Instructions  Plan: Checking B12 ans thyroid levels  Referring to neurology to evaluate the weakness.  Refer for stress test (may need visit w/ cardiology, will let you know!).  You should get a call about the neuro referral and stress test     Orders Placed This Encounter  Procedures   Vitamin B12   TSH   Ambulatory referral to Neurology    Meds ordered this encounter  Medications   FLUoxetine HCl 60 MG TABS    Sig: Take 1 tablet by mouth daily.    Dispense:  90 tablet    Refill:  3     See below for relevant physical exam findings  See below for recent lab and imaging results reviewed   Medications, allergies, PMH, PSH, SocH, FamH reviewed below    Follow-up instructions: Return for RECHECK PENDING RESULTS / IF WORSE OR CHANGE.                                        Exam:  BP 131/71 (BP Location: Left Arm, Patient Position: Sitting, Cuff Size: Large)   Pulse 75   Temp 97.9 F (36.6 C) (Oral)   Wt 192 lb (87.1 kg)   BMI 35.12 kg/m  Constitutional: VS see above. General Appearance: alert, well-developed, well-nourished, NAD Neck: No masses, trachea midline.  Respiratory: Normal respiratory effort. no wheeze, no rhonchi, no rales Cardiovascular: S1/S2 normal, no murmur, no rub/gallop auscultated. RRR.  Musculoskeletal: Gait normal. Symmetric and independent movement of all extremities Abdominal: non-tender, non-distended, no appreciable organomegaly, neg Murphy's, BS WNLx4 Neurological: Normal balance/coordination. No tremor. L sided facial droop stable. (+)tinel's and phalen's on L hand.  Skin: warm, dry, intact.  Psychiatric: Normal judgment/insight. Normal mood and affect. Oriented x3.   No outpatient medications have been marked as taking for the 12/23/20 encounter (Office Visit) with Emeterio Reeve, DO.    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   Encounter for assisted reproductive fertility procedure cycle 02/03/2014    Family History  Problem Relation Age of Onset   Hyperlipidemia Father    Hypertension  Father    Heart attack Father        1st at age 41   Alcohol abuse Maternal Grandfather    Diabetes Paternal Grandmother    Stomach cancer Paternal 2        age 52   Breast cancer Paternal Grandmother        age 40   Heart attack Maternal Aunt    Heart attack Maternal Uncle    Heart attack Paternal Grandfather    Melanoma Paternal Grandfather        age 32   Breast cancer Other        age 46   Breast cancer Paternal 42        age 47   Healthy Mother     Social History   Tobacco Use  Smoking Status Never  Smokeless Tobacco Never    Past Surgical History:  Procedure Laterality Date   CESAREAN SECTION     [phone]   FACIAL RECONSTRUCTION SURGERY  2011   For attempted nerve damage repair Bell's palsy    Immunization History  Administered Date(s) Administered   DTaP 08/28/1979, 10/30/1979, 12/31/1979, 07/17/1996   IPV 08/28/1979, 10/30/1979, 12/31/1979   Influenza-Unspecified 03/14/2001   MMR 05/28/1981, 07/27/1996   Tdap 02/13/2006, 09/12/2017    No results found for this or any previous visit (from the past 2160 hour(s)).  No results found.     All questions at time of visit were answered - patient instructed to contact office with any additional concerns or updates. ER/RTC precautions were reviewed with the patient as applicable.   Please note: manual typing as well as voice recognition software may have been used to produce this document - typos may escape review. Please contact Dr. Sheppard Coil for any needed clarifications.

## 2020-12-23 NOTE — Telephone Encounter (Signed)
Transition Care Management Follow-up Telephone Call Date of discharge and from where: 12/21/20 from [facility] How have you been since you were released from the hospital? Patient had OV with Dr. Sheppard Coil 12/23/20 at 1110. Any questions or concerns? No

## 2020-12-24 LAB — TSH: TSH: 2.45 mIU/L

## 2021-03-18 ENCOUNTER — Ambulatory Visit: Payer: Managed Care, Other (non HMO) | Admitting: Neurology

## 2021-03-25 ENCOUNTER — Ambulatory Visit: Payer: Managed Care, Other (non HMO) | Admitting: Neurology

## 2021-03-25 ENCOUNTER — Encounter: Payer: Self-pay | Admitting: Neurology

## 2021-12-22 ENCOUNTER — Other Ambulatory Visit: Payer: Self-pay | Admitting: Osteopathic Medicine

## 2022-01-20 ENCOUNTER — Other Ambulatory Visit: Payer: Self-pay | Admitting: Medical-Surgical

## 2022-01-26 ENCOUNTER — Other Ambulatory Visit: Payer: Self-pay

## 2022-01-26 MED ORDER — FLUOXETINE HCL 60 MG PO TABS: 1.0000 | ORAL_TABLET | Freq: Every day | ORAL | 0 refills | Status: DC

## 2022-02-24 ENCOUNTER — Ambulatory Visit (INDEPENDENT_AMBULATORY_CARE_PROVIDER_SITE_OTHER): Payer: Managed Care, Other (non HMO) | Admitting: Family Medicine

## 2022-02-24 ENCOUNTER — Encounter: Payer: Self-pay | Admitting: Family Medicine

## 2022-02-24 VITALS — BP 126/84 | HR 78 | Ht 62.0 in | Wt 201.0 lb

## 2022-02-24 DIAGNOSIS — F321 Major depressive disorder, single episode, moderate: Secondary | ICD-10-CM | POA: Diagnosis not present

## 2022-02-24 DIAGNOSIS — F411 Generalized anxiety disorder: Secondary | ICD-10-CM

## 2022-02-24 MED ORDER — FLUOXETINE HCL 40 MG PO CAPS: 40.0000 mg | ORAL_CAPSULE | Freq: Every day | ORAL | 3 refills | Status: DC

## 2022-02-24 MED ORDER — FLUOXETINE HCL 40 MG PO CAPS: 80.0000 mg | ORAL_CAPSULE | Freq: Every day | ORAL | 3 refills | Status: DC

## 2022-02-24 NOTE — Assessment & Plan Note (Signed)
-   pt going through a lot right now and has been having trouble with increasing anxiety  - will go ahead and increase fluoxetine to '80mg'$   - will let me know how she does in 4-6 weeks. If no improvement will need to switch medications

## 2022-02-24 NOTE — Progress Notes (Signed)
Established patient visit   Patient: Phyllis Glass   DOB: February 06, 1981   41 y.o. Female  MRN: [id number] Visit Date: 02/24/2022  Today's healthcare provider: Owens Loffler, DO   Chief Complaint  Patient presents with   [place]    Chief Complaint  Patient presents with   Establish Care   HPI  Pt presents to establish care. She has a pmh of anxiety and depression. She is on fluoxetine '60mg'$  and feels like it is not working well. She has been under a lot of stress with her children and he husband is a Hydrographic surveyor for childhood sex crimes and it is a lot. She also admits financial stress as well.   Review of Systems  Constitutional:  Negative for activity change, fatigue and fever.  Respiratory:  Negative for cough and shortness of breath.   Cardiovascular:  Negative for chest pain.  Gastrointestinal:  Negative for abdominal pain.  Genitourinary:  Negative for difficulty urinating.  Psychiatric/Behavioral:  The patient is nervous/anxious.        Current Meds  Medication Sig   FLUoxetine (PROZAC) 40 MG capsule Take 2 capsules (80 mg total) by mouth daily.   [DISCONTINUED] FLUoxetine (PROZAC) 40 MG capsule Take 1 capsule (40 mg total) by mouth daily.   [DISCONTINUED] FLUoxetine HCl 60 MG TABS Take 1 tablet by mouth daily. PATIENT MUST KEEP UPCOMING APPOINTMENT.    OBJECTIVE    BP 126/84   Pulse 78   Ht '5\' 2"'$  (1.575 m)   Wt 201 lb (91.2 kg)   SpO2 100%   BMI 36.76 kg/m   Physical Exam Vitals and nursing note reviewed.  Constitutional:      General: She is not in acute distress.    Appearance: Normal appearance.  HENT:     Head: Normocephalic and atraumatic.     Right Ear: External ear normal.     Left Ear: External ear normal.     Nose: Nose normal.  Eyes:     Conjunctiva/sclera: Conjunctivae normal.  Cardiovascular:     Rate and Rhythm: Normal rate and regular rhythm.  Pulmonary:     Effort: Pulmonary effort is normal.      Breath sounds: Normal breath sounds.  Neurological:     General: No focal deficit present.     Mental Status: She is alert and oriented to person, place, and time.  Psychiatric:        Mood and Affect: Mood normal.        Behavior: Behavior normal.        Thought Content: Thought content normal.        Judgment: Judgment normal.      PHQ-17 GAD7: 18    ASSESSMENT & PLAN    Problem List Items Addressed This Visit       Other   Generalized anxiety disorder    - pt going through a lot right now and has been having trouble with increasing anxiety  - will go ahead and increase fluoxetine to '80mg'$   - will let me know how she does in 4-6 weeks. If no improvement will need to switch medications       Relevant Medications   FLUoxetine (PROZAC) 40 MG capsule   Depression, major, single episode, moderate (HCC) - Primary    - pt elevated PHQ9 that requires increase in dosage. According to UTD can increase to '80mg'$  daily for MDD  - have increased to '80mg'$ . If  does not work then will need to switch to another medication      Relevant Medications   FLUoxetine (PROZAC) 40 MG capsule    Return in about 5 weeks (around 03/31/2022).      Meds ordered this encounter  Medications   DISCONTD: FLUoxetine (PROZAC) 40 MG capsule    Sig: Take 1 capsule (40 mg total) by mouth daily.    Dispense:  90 capsule    Refill:  3   FLUoxetine (PROZAC) 40 MG capsule    Sig: Take 2 capsules (80 mg total) by mouth daily.    Dispense:  90 capsule    Refill:  3    No orders of the defined types were placed in this encounter.    Owens Loffler, DO  [facility] [phone] (phone) [phone] (fax)  [place]

## 2022-02-24 NOTE — Patient Instructions (Signed)
Send me a mychart message in 4-6 weeks about progress

## 2022-02-24 NOTE — Assessment & Plan Note (Signed)
-   pt elevated PHQ9 that requires increase in dosage. According to UTD can increase to '80mg'$  daily for MDD  - have increased to '80mg'$ . If does not work then will need to switch to another medication

## 2022-02-28 ENCOUNTER — Other Ambulatory Visit: Payer: Self-pay | Admitting: Medical-Surgical

## 2022-05-10 ENCOUNTER — Ambulatory Visit (INDEPENDENT_AMBULATORY_CARE_PROVIDER_SITE_OTHER): Payer: Managed Care, Other (non HMO) | Admitting: Physician Assistant

## 2022-05-10 VITALS — BP 125/74 | HR 90 | Ht 62.0 in | Wt 201.0 lb

## 2022-05-10 DIAGNOSIS — Z111 Encounter for screening for respiratory tuberculosis: Secondary | ICD-10-CM | POA: Diagnosis not present

## 2022-05-10 NOTE — Progress Notes (Signed)
Agree with above plan.  Follow up in 48-72 hours to have read.

## 2022-05-10 NOTE — Progress Notes (Signed)
Pt presented for TB skin test.  Tolerated well. No obvious reaction.  Location: R forearm

## 2022-05-12 ENCOUNTER — Ambulatory Visit (INDEPENDENT_AMBULATORY_CARE_PROVIDER_SITE_OTHER): Payer: Managed Care, Other (non HMO) | Admitting: Physician Assistant

## 2022-05-12 VITALS — Temp 97.2°F | Ht 62.0 in | Wt 201.0 lb

## 2022-05-12 DIAGNOSIS — Z111 Encounter for screening for respiratory tuberculosis: Secondary | ICD-10-CM | POA: Diagnosis not present

## 2022-05-12 LAB — TB SKIN TEST
Induration: 0 mm
TB Skin Test: NEGATIVE

## 2022-05-12 NOTE — Progress Notes (Signed)
Patient presented for PPD read. Results: Negative. Omm

## 2022-05-12 NOTE — Progress Notes (Signed)
Agree with above plan. 

## 2022-10-11 ENCOUNTER — Other Ambulatory Visit: Payer: Self-pay | Admitting: Family Medicine

## 2022-10-11 DIAGNOSIS — F321 Major depressive disorder, single episode, moderate: Secondary | ICD-10-CM

## 2022-11-07 ENCOUNTER — Other Ambulatory Visit: Payer: Self-pay | Admitting: Family Medicine

## 2022-11-07 DIAGNOSIS — F321 Major depressive disorder, single episode, moderate: Secondary | ICD-10-CM

## 2022-11-10 ENCOUNTER — Telehealth: Payer: Self-pay | Admitting: Family Medicine

## 2022-11-10 ENCOUNTER — Other Ambulatory Visit: Payer: Self-pay

## 2022-11-10 ENCOUNTER — Other Ambulatory Visit: Payer: Self-pay | Admitting: Family Medicine

## 2022-11-10 DIAGNOSIS — Z0000 Encounter for general adult medical examination without abnormal findings: Secondary | ICD-10-CM

## 2022-11-10 NOTE — Telephone Encounter (Signed)
Sent in

## 2022-11-10 NOTE — Telephone Encounter (Signed)
Pt  called. She wants labs order before physical on 7/2.

## 2022-11-11 NOTE — Telephone Encounter (Signed)
No problem.

## 2022-11-15 ENCOUNTER — Encounter: Payer: Self-pay | Admitting: Family Medicine

## 2022-11-15 ENCOUNTER — Ambulatory Visit (INDEPENDENT_AMBULATORY_CARE_PROVIDER_SITE_OTHER): Payer: BC Managed Care – PPO | Admitting: Family Medicine

## 2022-11-15 VITALS — BP 123/69 | HR 80 | Resp 18 | Ht 62.0 in | Wt 196.2 lb

## 2022-11-15 DIAGNOSIS — Z1231 Encounter for screening mammogram for malignant neoplasm of breast: Secondary | ICD-10-CM | POA: Diagnosis not present

## 2022-11-15 DIAGNOSIS — F411 Generalized anxiety disorder: Secondary | ICD-10-CM

## 2022-11-15 DIAGNOSIS — Z0000 Encounter for general adult medical examination without abnormal findings: Secondary | ICD-10-CM | POA: Diagnosis not present

## 2022-11-15 LAB — CBC
HCT: 38 % (ref 35.0–45.0)
Hemoglobin: 11.8 g/dL (ref 11.7–15.5)
MCH: 24.9 pg — ABNORMAL LOW (ref 27.0–33.0)
MCHC: 31.1 g/dL — ABNORMAL LOW (ref 32.0–36.0)
MCV: 80.3 fL (ref 80.0–100.0)
MPV: 10.1 fL (ref 7.5–12.5)
Platelets: 277 10*3/uL (ref 140–400)
RBC: 4.73 10*6/uL (ref 3.80–5.10)
RDW: 13.6 % (ref 11.0–15.0)
WBC: 5.6 10*3/uL (ref 3.8–10.8)

## 2022-11-15 LAB — BASIC METABOLIC PANEL WITH GFR
BUN: 8 mg/dL (ref 7–25)
CO2: 28 mmol/L (ref 20–32)
Calcium: 8.8 mg/dL (ref 8.6–10.2)
Chloride: 103 mmol/L (ref 98–110)
Creat: 0.78 mg/dL (ref 0.50–0.99)
Glucose, Bld: 95 mg/dL (ref 65–99)
Potassium: 4.4 mmol/L (ref 3.5–5.3)
Sodium: 137 mmol/L (ref 135–146)
eGFR: 97 mL/min/{1.73_m2} (ref >=60)

## 2022-11-15 LAB — LIPID PANEL
Cholesterol: 201 mg/dL — ABNORMAL HIGH (ref <200)
HDL: 62 mg/dL (ref >=50)
LDL Cholesterol (Calc): 107 mg/dL (calc) — ABNORMAL HIGH
Non-HDL Cholesterol (Calc): 139 mg/dL (calc) — ABNORMAL HIGH (ref <130)
Total CHOL/HDL Ratio: 3.2 (calc) (ref <5.0)
Triglycerides: 208 mg/dL — ABNORMAL HIGH (ref <150)

## 2022-11-15 MED ORDER — FLUOXETINE HCL 60 MG PO TABS: 60.0000 mg | ORAL_TABLET | Freq: Every day | ORAL | 2 refills | Status: DC

## 2022-11-15 NOTE — Assessment & Plan Note (Signed)
Pt presents for wellness exam - deferred pap today - discussed blood work today with patient and discussed decreasing high cholesterol foods.

## 2022-11-15 NOTE — Assessment & Plan Note (Signed)
-   pt feels like prozac needs to decrease to 60mg  have sent in 90 day supply - follow up 6 months

## 2022-11-15 NOTE — Progress Notes (Signed)
Established patient visit   Patient: Phyllis Glass   DOB: June 22, 1980   42 y.o. Female  MRN: [id number] Visit Date: 11/15/2022  Today's healthcare provider: Charlton Amor, DO   Chief Complaint  Patient presents with   Annual Exam    Pt had blood work done yesterday prior to appt.    SUBJECTIVE    Chief Complaint  Patient presents with   Annual Exam    Pt had blood work done yesterday prior to appt.   HPI  Pt presents for wellness visit.   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  Respiratory:  Negative for cough and shortness of breath.   Cardiovascular:  Negative for chest pain.  Gastrointestinal:  Negative for abdominal pain.  Genitourinary:  Negative for difficulty urinating.       Current Meds  Medication Sig   FLUoxetine HCl 60 MG TABS Take 60 mg by mouth daily.   [DISCONTINUED] FLUoxetine (PROZAC) 40 MG capsule TAKE 2 CASPULES BY MOUTH DAILY    OBJECTIVE    BP 123/69 (BP Location: Right Arm, Patient Position: Sitting, Cuff Size: Large)   Pulse 80   Resp 18   Ht 5\' 2"  (1.575 m)   Wt 196 lb 4 oz (89 kg)   SpO2 99%   BMI 35.89 kg/m   Physical Exam Vitals and nursing note reviewed.  Constitutional:      General: She is not in acute distress.    Appearance: Normal appearance.  HENT:     Head: Normocephalic and atraumatic.     Right Ear: External ear normal.     Left Ear: External ear normal.     Nose: Nose normal.  Eyes:     Conjunctiva/sclera: Conjunctivae normal.  Cardiovascular:     Rate  and Rhythm: Normal rate and regular rhythm.  Pulmonary:     Effort: Pulmonary effort is normal.     Breath sounds: Normal breath sounds.  Neurological:     General: No focal deficit present.     Mental Status: She is alert and oriented to person, place, and time.  Psychiatric:        Mood and Affect: Mood normal.        Behavior: Behavior normal.        Thought Content: Thought content normal.        Judgment: Judgment normal.       ASSESSMENT & PLAN    Problem List Items Addressed This Visit       Other   Generalized anxiety disorder    - pt feels like prozac needs to decrease to 60mg  have sent in 90 day supply - follow up 6 months      Relevant Medications   FLUoxetine HCl 60 MG TABS   Routine adult health maintenance - Primary    Pt presents for wellness exam - deferred pap today - discussed blood work today with patient and discussed decreasing high cholesterol foods.      Other Visit Diagnoses  Encounter for screening mammogram for malignant neoplasm of breast       Relevant Orders   MM DIGITAL SCREENING BILATERAL       Return in about 6 months (around 05/18/2023) for cholesterol labs and anxiety follow up.      Meds ordered this encounter  Medications   FLUoxetine HCl 60 MG TABS    Sig: Take 60 mg by mouth daily.    Dispense:  90 tablet    Refill:  2    Orders Placed This Encounter  Procedures   MM DIGITAL SCREENING BILATERAL    Standing Status:   Future    Standing Expiration Date:   11/15/2023    Order Specific Question:   Is the patient pregnant?    Answer:   No    Order Specific Question:   Preferred imaging location?    Answer:   [facility]    Order Specific Question:   Reason for exam:    Answer:   screening for breast cancer    Order Specific Question:   Release to patient    Answer:   Immediate     Charlton Amor, DO  [facility] at [facility] [phone] (phone) [phone]  (fax)  [facility]

## 2022-12-09 ENCOUNTER — Ambulatory Visit
Admission: RE | Admit: 2022-12-09 | Discharge: 2022-12-09 | Disposition: A | Discharge status: Home / Self Care | Payer: BC Managed Care – PPO | Source: Ambulatory Visit | Attending: Family Medicine | Admitting: Family Medicine

## 2022-12-09 DIAGNOSIS — Z1231 Encounter for screening mammogram for malignant neoplasm of breast: Secondary | ICD-10-CM

## 2023-03-20 ENCOUNTER — Telehealth: Payer: Self-pay | Admitting: Family Medicine

## 2023-03-20 NOTE — Telephone Encounter (Signed)
Patient called to check status of her message I told her she needed to schedule an appointment she declined

## 2023-03-20 NOTE — Telephone Encounter (Signed)
Patient called our on call nurse Sunday thru our office she stated that she called in a medication for Strep throat and prescribed an antibiotic she was given 3 doses and was told to call her pcp to get the rest of the prescription

## 2023-03-25 DIAGNOSIS — J019 Acute sinusitis, unspecified: Secondary | ICD-10-CM | POA: Diagnosis not present

## 2023-03-25 DIAGNOSIS — J029 Acute pharyngitis, unspecified: Secondary | ICD-10-CM | POA: Diagnosis not present

## 2023-03-25 DIAGNOSIS — R051 Acute cough: Secondary | ICD-10-CM | POA: Diagnosis not present

## 2023-03-25 DIAGNOSIS — R0981 Nasal congestion: Secondary | ICD-10-CM | POA: Diagnosis not present

## 2023-05-04 ENCOUNTER — Ambulatory Visit: Payer: BC Managed Care – PPO | Admitting: Family Medicine

## 2023-05-04 ENCOUNTER — Encounter: Payer: Self-pay | Admitting: Family Medicine

## 2023-05-04 VITALS — BP 133/77 | HR 83 | Ht 62.0 in | Wt 196.2 lb

## 2023-05-04 DIAGNOSIS — R220 Localized swelling, mass and lump, head: Secondary | ICD-10-CM | POA: Insufficient documentation

## 2023-05-04 DIAGNOSIS — M62838 Other muscle spasm: Secondary | ICD-10-CM | POA: Diagnosis not present

## 2023-05-04 DIAGNOSIS — R21 Rash and other nonspecific skin eruption: Secondary | ICD-10-CM | POA: Diagnosis not present

## 2023-05-04 DIAGNOSIS — H5789 Other specified disorders of eye and adnexa: Secondary | ICD-10-CM | POA: Diagnosis not present

## 2023-05-04 DIAGNOSIS — F339 Major depressive disorder, recurrent, unspecified: Secondary | ICD-10-CM

## 2023-05-04 MED ORDER — CYCLOBENZAPRINE HCL 10 MG PO TABS: 10.0000 mg | ORAL_TABLET | Freq: Every evening | ORAL | 0 refills | Status: DC | PRN

## 2023-05-04 MED ORDER — POLYMYXIN B-TRIMETHOPRIM 10000-0.1 UNIT/ML-% OP SOLN: 2.0000 [drp] | OPHTHALMIC | 0 refills | Status: AC

## 2023-05-04 MED ORDER — METHYLPREDNISOLONE 4 MG PO TBPK: ORAL_TABLET | ORAL | 0 refills | Status: DC

## 2023-05-04 MED ORDER — FLUOXETINE HCL 60 MG PO TABS: 60.0000 mg | ORAL_TABLET | Freq: Every day | ORAL | 2 refills | Status: DC

## 2023-05-04 NOTE — Progress Notes (Signed)
Established patient visit   Patient: Phyllis Glass   DOB: Jun 10, 1980   42 y.o. Female  MRN: [id number] Visit Date: 05/04/2023  Today's healthcare provider: Charlton Amor, DO   Chief Complaint  Patient presents with   eye infection    Pt states she noticed about three wks ago also noticed red bumps on her right hand    SUBJECTIVE    Chief Complaint  Patient presents with   eye infection    Pt states she noticed about three wks ago also noticed red bumps on her right hand   HPI HPI     eye infection    Additional comments: Pt states she noticed about three wks ago also noticed red bumps on her right hand      Last edited by Roselyn Reef, CMA on 05/04/2023  1:34 PM.      Pt presents with concern of left eye swelling. Has noticed some right eye drainage. Has had 3 bumps on her right hand. Also notes some left sided pain that was sharp yesterday.  Review of Systems  Constitutional:  Negative for activity change, fatigue and fever.  Respiratory:  Negative for cough and shortness of breath.   Cardiovascular:  Negative for chest pain.  Gastrointestinal:  Negative for abdominal pain.  Genitourinary:  Negative for difficulty urinating.       Current Meds  Medication Sig   cyclobenzaprine (FLEXERIL) 10 MG tablet Take 1 tablet (10 mg total) by mouth at bedtime as needed for muscle spasms.   FLUoxetine HCl 60 MG TABS Take 60 mg by mouth daily.   methylPREDNISolone (MEDROL DOSEPAK) 4 MG TBPK tablet Follow instructions on pill pack   trimethoprim-polymyxin b (POLYTRIM) ophthalmic solution Place 2 drops into the left eye every 4 (four) hours for 5 days.    OBJECTIVE    BP 133/77 (BP Location: Left Arm, Patient Position: Sitting, Cuff Size: Large)   Pulse 83   Ht 5\' 2"  (1.575 m)   Wt 196 lb 3 oz (89 kg)   SpO2 97%   BMI 35.88 kg/m   Physical Exam Vitals and nursing note reviewed.  Constitutional:      General: She is not in acute distress.    Appearance:  Normal appearance.  HENT:     Head: Normocephalic and atraumatic.     Right Ear: External ear normal.     Left Ear: External ear normal.     Nose: Nose normal.  Eyes:     Conjunctiva/sclera: Conjunctivae normal.  Cardiovascular:     Rate and Rhythm: Normal rate.  Pulmonary:     Effort: Pulmonary effort is normal.  Skin:    Comments: No lesions on feet Three pinpoint red bumps on right hand middle finger, NTTP  Neurological:     General: No focal deficit present.     Mental Status: She is alert and oriented to person, place, and time.  Psychiatric:        Mood and Affect: Mood normal.        Behavior: Behavior normal.        Thought Content: Thought content normal.        Judgment: Judgment normal.        ASSESSMENT & PLAN    Problem List Items Addressed This Visit       Musculoskeletal and Integument   Rash of hand   3 small pinpoint red lesions on right hand. Non-tender to palpation. Told patient we will keep  an eye on this. 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 Physical exam was nonspecific - given flexeril for pain  - recommended stretches and heating pad      Discharge of left eye   Will treat with polytrim given her occupation and discharge. If it does not resolve discussed needing to go to eye doctor to evaluate for other differentials like tear duct      Facial swelling   Left sided facial swelling present. Pt says it feels puffier to her. Will go ahead and do medrol dose pack to help knock down some inflammation       Return if symptoms worsen or fail to improve.      Meds ordered this encounter  Medications   cyclobenzaprine (FLEXERIL) 10 MG tablet    Sig: Take 1 tablet (10 mg total) by mouth at  bedtime as needed for muscle spasms.    Dispense:  30 tablet    Refill:  0   trimethoprim-polymyxin b (POLYTRIM) ophthalmic solution    Sig: Place 2 drops into the left eye every 4 (four) hours for 5 days.    Dispense:  10 mL    Refill:  0   FLUoxetine HCl 60 MG TABS    Sig: Take 60 mg by mouth daily.    Dispense:  90 tablet    Refill:  2   methylPREDNISolone (MEDROL DOSEPAK) 4 MG TBPK tablet    Sig: Follow instructions on pill pack    Dispense:  21 tablet    Refill:  0    No orders of the defined types were placed in this encounter.    Charlton Amor, DO  [facility] at [facility] [place] [phone] (phone) [phone] (fax)  [facility]

## 2023-05-04 NOTE — Assessment & Plan Note (Signed)
3 small pinpoint red lesions on right hand. Non-tender to palpation. Told patient we will keep an eye on this. 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

## 2023-05-04 NOTE — Assessment & Plan Note (Addendum)
Likely sided pain is muscle spasm. Physical exam was nonspecific - given flexeril for pain  - recommended stretches and heating pad

## 2023-05-04 NOTE — Assessment & Plan Note (Signed)
Left sided facial swelling present. Pt says it feels puffier to her. Will go ahead and do medrol dose pack to help knock down some inflammation

## 2023-05-04 NOTE — Assessment & Plan Note (Signed)
Will treat with polytrim given her occupation and discharge. If it does not resolve discussed needing to go to eye doctor to evaluate for other differentials like tear duct

## 2023-05-04 NOTE — Assessment & Plan Note (Signed)
Doing well needs fluoxetine refills

## 2023-05-18 ENCOUNTER — Ambulatory Visit: Payer: BC Managed Care – PPO | Admitting: Family Medicine

## 2023-09-21 ENCOUNTER — Encounter: Payer: Self-pay | Admitting: Family Medicine

## 2023-11-20 DIAGNOSIS — M9904 Segmental and somatic dysfunction of sacral region: Secondary | ICD-10-CM | POA: Diagnosis not present

## 2023-11-20 DIAGNOSIS — M9903 Segmental and somatic dysfunction of lumbar region: Secondary | ICD-10-CM | POA: Diagnosis not present

## 2023-11-20 DIAGNOSIS — M9902 Segmental and somatic dysfunction of thoracic region: Secondary | ICD-10-CM | POA: Diagnosis not present

## 2023-11-20 DIAGNOSIS — M9901 Segmental and somatic dysfunction of cervical region: Secondary | ICD-10-CM | POA: Diagnosis not present

## 2023-11-21 DIAGNOSIS — M5432 Sciatica, left side: Secondary | ICD-10-CM | POA: Diagnosis not present

## 2023-11-21 DIAGNOSIS — M5442 Lumbago with sciatica, left side: Secondary | ICD-10-CM | POA: Diagnosis not present

## 2023-11-21 DIAGNOSIS — M4724 Other spondylosis with radiculopathy, thoracic region: Secondary | ICD-10-CM | POA: Diagnosis not present

## 2023-11-21 DIAGNOSIS — M4721 Other spondylosis with radiculopathy, occipito-atlanto-axial region: Secondary | ICD-10-CM | POA: Diagnosis not present

## 2023-11-22 DIAGNOSIS — M5442 Lumbago with sciatica, left side: Secondary | ICD-10-CM | POA: Diagnosis not present

## 2023-11-22 DIAGNOSIS — M5432 Sciatica, left side: Secondary | ICD-10-CM | POA: Diagnosis not present

## 2023-11-22 DIAGNOSIS — M4724 Other spondylosis with radiculopathy, thoracic region: Secondary | ICD-10-CM | POA: Diagnosis not present

## 2023-11-22 DIAGNOSIS — M4721 Other spondylosis with radiculopathy, occipito-atlanto-axial region: Secondary | ICD-10-CM | POA: Diagnosis not present

## 2023-11-23 DIAGNOSIS — M4724 Other spondylosis with radiculopathy, thoracic region: Secondary | ICD-10-CM | POA: Diagnosis not present

## 2023-11-23 DIAGNOSIS — M5442 Lumbago with sciatica, left side: Secondary | ICD-10-CM | POA: Diagnosis not present

## 2023-11-23 DIAGNOSIS — M5432 Sciatica, left side: Secondary | ICD-10-CM | POA: Diagnosis not present

## 2023-11-23 DIAGNOSIS — M4721 Other spondylosis with radiculopathy, occipito-atlanto-axial region: Secondary | ICD-10-CM | POA: Diagnosis not present

## 2023-11-27 DIAGNOSIS — M5432 Sciatica, left side: Secondary | ICD-10-CM | POA: Diagnosis not present

## 2023-11-27 DIAGNOSIS — M5442 Lumbago with sciatica, left side: Secondary | ICD-10-CM | POA: Diagnosis not present

## 2023-11-27 DIAGNOSIS — M4721 Other spondylosis with radiculopathy, occipito-atlanto-axial region: Secondary | ICD-10-CM | POA: Diagnosis not present

## 2023-11-27 DIAGNOSIS — M4724 Other spondylosis with radiculopathy, thoracic region: Secondary | ICD-10-CM | POA: Diagnosis not present

## 2023-11-28 DIAGNOSIS — M4724 Other spondylosis with radiculopathy, thoracic region: Secondary | ICD-10-CM | POA: Diagnosis not present

## 2023-11-28 DIAGNOSIS — M5442 Lumbago with sciatica, left side: Secondary | ICD-10-CM | POA: Diagnosis not present

## 2023-11-28 DIAGNOSIS — M5432 Sciatica, left side: Secondary | ICD-10-CM | POA: Diagnosis not present

## 2023-11-28 DIAGNOSIS — M4721 Other spondylosis with radiculopathy, occipito-atlanto-axial region: Secondary | ICD-10-CM | POA: Diagnosis not present

## 2023-11-29 DIAGNOSIS — M4724 Other spondylosis with radiculopathy, thoracic region: Secondary | ICD-10-CM | POA: Diagnosis not present

## 2023-11-29 DIAGNOSIS — M4721 Other spondylosis with radiculopathy, occipito-atlanto-axial region: Secondary | ICD-10-CM | POA: Diagnosis not present

## 2023-11-29 DIAGNOSIS — M5432 Sciatica, left side: Secondary | ICD-10-CM | POA: Diagnosis not present

## 2023-11-29 DIAGNOSIS — M5442 Lumbago with sciatica, left side: Secondary | ICD-10-CM | POA: Diagnosis not present

## 2023-12-02 DIAGNOSIS — J069 Acute upper respiratory infection, unspecified: Secondary | ICD-10-CM | POA: Diagnosis not present

## 2023-12-04 DIAGNOSIS — M5432 Sciatica, left side: Secondary | ICD-10-CM | POA: Diagnosis not present

## 2023-12-04 DIAGNOSIS — M4721 Other spondylosis with radiculopathy, occipito-atlanto-axial region: Secondary | ICD-10-CM | POA: Diagnosis not present

## 2023-12-04 DIAGNOSIS — M4724 Other spondylosis with radiculopathy, thoracic region: Secondary | ICD-10-CM | POA: Diagnosis not present

## 2023-12-04 DIAGNOSIS — M5442 Lumbago with sciatica, left side: Secondary | ICD-10-CM | POA: Diagnosis not present

## 2023-12-05 DIAGNOSIS — M4721 Other spondylosis with radiculopathy, occipito-atlanto-axial region: Secondary | ICD-10-CM | POA: Diagnosis not present

## 2023-12-05 DIAGNOSIS — M5432 Sciatica, left side: Secondary | ICD-10-CM | POA: Diagnosis not present

## 2023-12-05 DIAGNOSIS — M4724 Other spondylosis with radiculopathy, thoracic region: Secondary | ICD-10-CM | POA: Diagnosis not present

## 2023-12-05 DIAGNOSIS — M5442 Lumbago with sciatica, left side: Secondary | ICD-10-CM | POA: Diagnosis not present

## 2023-12-06 DIAGNOSIS — M4721 Other spondylosis with radiculopathy, occipito-atlanto-axial region: Secondary | ICD-10-CM | POA: Diagnosis not present

## 2023-12-06 DIAGNOSIS — M5432 Sciatica, left side: Secondary | ICD-10-CM | POA: Diagnosis not present

## 2023-12-06 DIAGNOSIS — M4724 Other spondylosis with radiculopathy, thoracic region: Secondary | ICD-10-CM | POA: Diagnosis not present

## 2023-12-06 DIAGNOSIS — M5442 Lumbago with sciatica, left side: Secondary | ICD-10-CM | POA: Diagnosis not present

## 2023-12-11 DIAGNOSIS — M5432 Sciatica, left side: Secondary | ICD-10-CM | POA: Diagnosis not present

## 2023-12-11 DIAGNOSIS — M4724 Other spondylosis with radiculopathy, thoracic region: Secondary | ICD-10-CM | POA: Diagnosis not present

## 2023-12-11 DIAGNOSIS — M5442 Lumbago with sciatica, left side: Secondary | ICD-10-CM | POA: Diagnosis not present

## 2023-12-11 DIAGNOSIS — M4721 Other spondylosis with radiculopathy, occipito-atlanto-axial region: Secondary | ICD-10-CM | POA: Diagnosis not present

## 2023-12-12 DIAGNOSIS — M4721 Other spondylosis with radiculopathy, occipito-atlanto-axial region: Secondary | ICD-10-CM | POA: Diagnosis not present

## 2023-12-12 DIAGNOSIS — M5442 Lumbago with sciatica, left side: Secondary | ICD-10-CM | POA: Diagnosis not present

## 2023-12-12 DIAGNOSIS — M5432 Sciatica, left side: Secondary | ICD-10-CM | POA: Diagnosis not present

## 2023-12-12 DIAGNOSIS — M4724 Other spondylosis with radiculopathy, thoracic region: Secondary | ICD-10-CM | POA: Diagnosis not present

## 2023-12-13 DIAGNOSIS — M5442 Lumbago with sciatica, left side: Secondary | ICD-10-CM | POA: Diagnosis not present

## 2023-12-13 DIAGNOSIS — M5432 Sciatica, left side: Secondary | ICD-10-CM | POA: Diagnosis not present

## 2023-12-13 DIAGNOSIS — M4724 Other spondylosis with radiculopathy, thoracic region: Secondary | ICD-10-CM | POA: Diagnosis not present

## 2023-12-13 DIAGNOSIS — M4721 Other spondylosis with radiculopathy, occipito-atlanto-axial region: Secondary | ICD-10-CM | POA: Diagnosis not present

## 2023-12-21 DIAGNOSIS — M4724 Other spondylosis with radiculopathy, thoracic region: Secondary | ICD-10-CM | POA: Diagnosis not present

## 2023-12-21 DIAGNOSIS — M5432 Sciatica, left side: Secondary | ICD-10-CM | POA: Diagnosis not present

## 2023-12-21 DIAGNOSIS — M4721 Other spondylosis with radiculopathy, occipito-atlanto-axial region: Secondary | ICD-10-CM | POA: Diagnosis not present

## 2023-12-21 DIAGNOSIS — M5442 Lumbago with sciatica, left side: Secondary | ICD-10-CM | POA: Diagnosis not present

## 2024-03-14 ENCOUNTER — Encounter: Payer: Self-pay | Admitting: Urgent Care

## 2024-03-14 ENCOUNTER — Other Ambulatory Visit (HOSPITAL_COMMUNITY): Payer: Self-pay

## 2024-03-14 ENCOUNTER — Ambulatory Visit: Admitting: Urgent Care

## 2024-03-14 VITALS — BP 135/84 | HR 76 | Ht 62.0 in | Wt 193.0 lb

## 2024-03-14 DIAGNOSIS — F321 Major depressive disorder, single episode, moderate: Secondary | ICD-10-CM

## 2024-03-14 DIAGNOSIS — Z0000 Encounter for general adult medical examination without abnormal findings: Secondary | ICD-10-CM

## 2024-03-14 DIAGNOSIS — Z1231 Encounter for screening mammogram for malignant neoplasm of breast: Secondary | ICD-10-CM

## 2024-03-14 MED ORDER — FLUOXETINE HCL 60 MG PO TABS: 60.0000 mg | ORAL_TABLET | Freq: Every day | ORAL | 2 refills | Status: DC

## 2024-03-14 NOTE — Patient Instructions (Signed)
 We completed your annual physical today. Will call with results of the labs.  Please schedule your pap smear in the next few weeks.

## 2024-03-14 NOTE — Progress Notes (Signed)
 Complete physical exam  Patient: Phyllis Glass   DOB: [birth year]   43 y.o. Female  MRN: [id number]  Subjective:    Chief Complaint  Patient presents with   Medical Management of Chronic Issues   Fatigue    Phyllis Glass is a 43 y.o. female who presents today for a complete physical exam. She reports consuming a general diet. Started weight watchers and has lost 10#. Walks for exercise. She generally feels well. She reports sleeping well. She does not have additional problems to discuss today.   Discussed the use of AI scribe software for clinical note transcription with the patient, who gave verbal consent to proceed.  History of Present Illness   Phyllis Glass is a 43 year old female who presents for medication management and annual physical exam.  She has been taking fluoxetine  for six to seven years and it is the only prescription medication she is currently on. She also takes a generic multivitamin and vitamin C over the counter.  She has a history of Bell's palsy from 20 years ago during her pregnancy, which was complicated by toxemia and preeclampsia. She uses eye drops daily but reports no new symptoms related to Bell's palsy.  She recently started Weight Watchers three weeks ago and has lost ten pounds. 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 She does not go to the gym but participates in family walks. She does not smoke or use illicit drugs and consumes alcohol one to two times a week. She sleeps well at night and follows up with a dentist twice a year and an eye doctor, although it has been over a year since her last eye exam.  She has several skin lesions on her lower body and lip, which she describes as not getting bigger. She mentions shaving one of them recently, which caused a cut.       Most recent fall risk assessment:    11/15/2022    1:55 PM  Fall Risk   Falls in the past year? 0  Number falls in past yr: 0   Injury with Fall? 0  Risk for fall due to : No Fall Risks  Follow up Falls evaluation completed     Most recent depression screenings:    11/15/2022    1:55 PM 02/24/2022    9:25 AM  PHQ 2/9 Scores  PHQ - 2 Score 0 4  PHQ- 9 Score 3 18    Vision:Not within last year  and Dental: No current dental problems and Receives regular dental care  Patient Active Problem List   Diagnosis Date Noted   Muscle spasm 05/04/2023   Discharge of left eye 05/04/2023   Rash of hand 05/04/2023   Facial swelling 05/04/2023   Routine adult health maintenance 11/15/2022   Depression, major, single episode, moderate (HCC) 02/24/2022   Neurocardiogenic syncope 12/22/2014   Allergic rhinitis 03/06/2014   Generalized anxiety disorder 03/06/2014   Bell palsy 03/06/2014   Carpal tunnel syndrome 03/06/2014   Recurrent depressive disorder 03/06/2014   DUB (dysfunctional uterine bleeding) 03/06/2014   Fatigue 03/06/2014   Keratosis pilaris 03/06/2014   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   Encounter for assisted reproductive fertility procedure cycle 02/03/2014   Past Medical History:  Diagnosis Date   Allergic rhinitis 03/06/2014   Bell palsy 03/06/2014   Carpal tunnel syndrome 03/06/2014   Depression    DUB (dysfunctional uterine bleeding) 03/06/2014   Encounter for assisted  reproductive fertility procedure cycle 02/03/2014   Foot sprain 03/06/2014   Neurocardiogenic syncope 12/22/2014   Overview:  DURING MEDICAL PROCEDURES PER PATIENT REPORT    Past Surgical History:  Procedure Laterality Date   CESAREAN SECTION     [phone]   FACIAL RECONSTRUCTION SURGERY  2011   For attempted nerve damage repair Bell's palsy   Social History   Tobacco Use   Smoking status: Never   Smokeless tobacco: Never  Vaping Use   Vaping status: Never Used  Substance Use Topics   Alcohol use: Yes    Alcohol/week: 0.0 standard drinks of alcohol    Comment: 1-2 per week   Drug use: Never       Patient Care Team: Lowella Benton CROME, [place] as PCP - General (Physician Assistant)   Outpatient Medications Prior to Visit  Medication Sig   ascorbic acid (VITAMIN C) 500 MG tablet Take 500 mg by mouth daily.   Multiple Vitamin (MULTIVITAMIN WITH MINERALS) TABS tablet Take 1 tablet by mouth daily.   [DISCONTINUED] FLUoxetine  HCl 60 MG TABS Take 60 mg by mouth daily.   [DISCONTINUED] cyclobenzaprine  (FLEXERIL ) 10 MG tablet Take 1 tablet (10 mg total) by mouth at bedtime as needed for muscle spasms. (Patient not taking: Reported on 03/14/2024)   [DISCONTINUED] methylPREDNISolone  (MEDROL  DOSEPAK) 4 MG TBPK tablet Follow instructions on pill pack (Patient not taking: Reported on 03/14/2024)   No facility-administered medications prior to visit.    ROS Complete 12 point ROS performed with all pertinent positives listed in HPI     Objective:     BP 135/84   Pulse 76   Ht 5' 2 (1.575 m)   Wt 193 lb (87.5 kg)   SpO2 99%   BMI 35.30 kg/m  BP Readings from Last 3 Encounters:  03/14/24 135/84  05/04/23 133/77  11/15/22 123/69   Wt Readings from Last 3 Encounters:  03/14/24 193 lb (87.5 kg)  05/04/23 196 lb 3 oz (89 kg)  11/15/22 196 lb 4 oz (89 kg)      Physical Exam Vitals and nursing note reviewed.  Constitutional:      General: She is not in acute distress.    Appearance: Normal appearance. She is not ill-appearing, toxic-appearing or diaphoretic.  HENT:     Head: Normocephalic and atraumatic.     Right Ear: Tympanic membrane, ear canal and external ear normal. There is no impacted cerumen.     Left Ear: Tympanic membrane, ear canal and external ear normal. There is no impacted cerumen.     Nose: Nose normal.     Mouth/Throat:     Mouth: Mucous membranes are moist.     Pharynx: Oropharynx is clear. No oropharyngeal exudate or posterior oropharyngeal erythema.  Eyes:     General: No scleral icterus.       Right eye: No discharge.        Left eye: No discharge.      Extraocular Movements: Extraocular movements intact.     Pupils: Pupils are equal, round, and reactive to light.  Neck:     Thyroid: No thyroid mass, thyromegaly or thyroid tenderness.  Cardiovascular:     Rate and Rhythm: Normal rate and regular rhythm.     Pulses: Normal pulses.     Heart sounds: No murmur heard. Pulmonary:     Effort: Pulmonary effort is normal. No respiratory distress.     Breath sounds: Normal breath sounds. No stridor. No wheezing or rhonchi.  Abdominal:  General: Abdomen is flat. Bowel sounds are normal. There is no distension.     Palpations: Abdomen is soft. There is no mass.     Tenderness: There is no abdominal tenderness. There is no guarding.  Musculoskeletal:     Cervical back: Normal range of motion and neck supple. No rigidity or tenderness.     Right lower leg: No edema.     Left lower leg: No edema.  Lymphadenopathy:     Cervical: No cervical adenopathy.  Skin:    General: Skin is warm and dry.     Coloration: Skin is not jaundiced.     Findings: No bruising, erythema or rash.     Comments: Several lesions noted, resembling dermatofibromas  Neurological:     General: No focal deficit present.     Mental Status: She is alert and oriented to person, place, and time.     Sensory: No sensory deficit.     Motor: No weakness.  Psychiatric:        Mood and Affect: Mood normal.        Behavior: Behavior normal.      No results found for any visits on 03/14/24. Last CBC Lab Results  Component Value Date   WBC 5.6 11/14/2022   HGB 11.8 11/14/2022   HCT 38.0 11/14/2022   MCV 80.3 11/14/2022   MCH 24.9 (L) 11/14/2022   RDW 13.6 11/14/2022   PLT 277 11/14/2022   Last metabolic panel Lab Results  Component Value Date   GLUCOSE 95 11/14/2022   NA 137 11/14/2022   K 4.4 11/14/2022   CL 103 11/14/2022   CO2 28 11/14/2022   BUN 8 11/14/2022   CREATININE 0.78 11/14/2022   EGFR 97 11/14/2022   CALCIUM 8.8 11/14/2022   PROT 6.9 09/11/2017    ALBUMIN 4.5 06/16/2015   BILITOT 0.4 09/11/2017   ALKPHOS 50 06/16/2015   AST 14 09/11/2017   ALT 10 09/11/2017   ANIONGAP 10 06/16/2015   Last lipids Lab Results  Component Value Date   CHOL 201 (H) 11/14/2022   HDL 62 11/14/2022   LDLCALC 107 (H) 11/14/2022   TRIG 208 (H) 11/14/2022   CHOLHDL 3.2 11/14/2022   Last hemoglobin A1c No results found for: HGBA1C Last thyroid functions Lab Results  Component Value Date   TSH 2.45 12/23/2020   Last vitamin D No results found for: 25OHVITD2, 25OHVITD3, VD25OH Last vitamin B12 and Folate No results found for: VITAMINB12, FOLATE      Assessment & Plan:    Routine Health Maintenance and Physical Exam  Immunization History  Administered Date(s) Administered   DTaP 08/28/1979, 10/30/1979, 12/31/1979, 07/17/1996   IPV 08/28/1979, 10/30/1979, 12/31/1979   Influenza-Unspecified 03/14/2001   MMR 05/28/1981, 07/27/1996   PPD Test 05/10/2022   Tdap 02/13/2006, 09/12/2017    Health Maintenance  Topic Date Due   Cervical Cancer Screening (HPV/Pap Cotest)  02/02/2020   COVID-19 Vaccine (1 - 2025-26 season) 03/30/2024 (Originally 01/15/2024)   Influenza Vaccine  08/13/2024 (Originally 12/15/2023)   Hepatitis B Vaccines 19-59 Average Risk (1 of 3 - 19+ 3-dose series) 03/14/2025 (Originally 06/27/1998)   HPV VACCINES (1 - 3-dose SCDM series) 03/14/2025 (Originally 06/27/2006)   Hepatitis C Screening  03/14/2025 (Originally 06/27/1997)   Mammogram  12/08/2024   DTaP/Tdap/Td (7 - Td or Tdap) 09/13/2027   HIV Screening  Completed   Pneumococcal Vaccine  Aged Out   Meningococcal B Vaccine  Aged Out    Discussed health benefits of physical  activity, and encouraged her to engage in regular exercise appropriate for her age and condition.  Problem List Items Addressed This Visit     Depression, major, single episode, moderate (HCC)   Relevant Medications   FLUoxetine  HCl 60 MG TABS   Routine adult health maintenance -  Primary   Relevant Orders   CBC with Differential/Platelet   Hemoglobin A1c   TSH   Lipid panel   Comprehensive metabolic panel with GFR   Other Visit Diagnoses       Screening mammogram for breast cancer       Relevant Orders   MM DIGITAL SCREENING BILATERAL      Return in about 1 year (around 03/14/2025) for Annual Physical.  Assessment and Plan    Adult Wellness Visit Annual wellness visit completed. Labs from last year satisfactory. No prior diabetes or thyroid screening. Slightly elevated triglycerides likely dietary. Engaged in Weight Watchers, lost 10 pounds. Physically active as a runner, broadcasting/film/video. - Perform routine annual lab panel including diabetes and thyroid screening, blood cell counts, cholesterol, kidney, and liver function tests. - Schedule Pap smear at future visit. - Order mammogram for convenience. - Ensure follow-up with dentist and eye doctor.  Major depressive disorder, single episode, unspecified On fluoxetine  for six to seven years without issues. - Continue fluoxetine  with 90-day supply and two refills. - Instruct to call for third refill if needed.  Benign skin lesions Multiple benign skin lesions resembling dermatofibromas, likely from minor trauma. No biopsy needed unless changes occur. - Advise to seek evaluation if lesions change significantly.          Benton LITTIE Gave, PA

## 2024-03-15 ENCOUNTER — Ambulatory Visit: Payer: Self-pay | Admitting: Urgent Care

## 2024-03-15 LAB — COMPREHENSIVE METABOLIC PANEL WITH GFR
ALT: 15 IU/L (ref 0–32)
AST: 16 IU/L (ref 0–40)
Albumin: 4.6 g/dL (ref 3.9–4.9)
Alkaline Phosphatase: 60 IU/L (ref 41–116)
BUN/Creatinine Ratio: 11 (ref 9–23)
BUN: 10 mg/dL (ref 6–24)
Bilirubin Total: 0.3 mg/dL (ref 0.0–1.2)
CO2: 20 mmol/L (ref 20–29)
Calcium: 9.4 mg/dL (ref 8.7–10.2)
Chloride: 100 mmol/L (ref 96–106)
Creatinine, Ser: 0.9 mg/dL (ref 0.57–1.00)
Globulin, Total: 2.8 g/dL (ref 1.5–4.5)
Glucose: 87 mg/dL (ref 70–99)
Potassium: 4.1 mmol/L (ref 3.5–5.2)
Sodium: 136 mmol/L (ref 134–144)
Total Protein: 7.4 g/dL (ref 6.0–8.5)
eGFR: 81 mL/min/1.73 (ref >59)

## 2024-03-15 LAB — CBC WITH DIFFERENTIAL/PLATELET
Basophils Absolute: 0.1 x10E3/uL (ref 0.0–0.2)
Basos: 1 %
EOS (ABSOLUTE): 0.2 x10E3/uL (ref 0.0–0.4)
Eos: 3 %
Hematocrit: 42.3 % (ref 34.0–46.6)
Hemoglobin: 13.5 g/dL (ref 11.1–15.9)
Immature Grans (Abs): 0 x10E3/uL (ref 0.0–0.1)
Immature Granulocytes: 0 %
Lymphocytes Absolute: 1.8 x10E3/uL (ref 0.7–3.1)
Lymphs: 29 %
MCH: 27.3 pg (ref 26.6–33.0)
MCHC: 31.9 g/dL (ref 31.5–35.7)
MCV: 86 fL (ref 79–97)
Monocytes Absolute: 0.5 x10E3/uL (ref 0.1–0.9)
Monocytes: 8 %
Neutrophils Absolute: 3.7 x10E3/uL (ref 1.4–7.0)
Neutrophils: 59 %
Platelets: 272 x10E3/uL (ref 150–450)
RBC: 4.95 x10E6/uL (ref 3.77–5.28)
RDW: 13.7 % (ref 11.7–15.4)
WBC: 6.2 x10E3/uL (ref 3.4–10.8)

## 2024-03-15 LAB — LIPID PANEL
Chol/HDL Ratio: 4.2 ratio (ref 0.0–4.4)
Cholesterol, Total: 233 mg/dL — ABNORMAL HIGH (ref 100–199)
HDL: 56 mg/dL (ref >39)
LDL Chol Calc (NIH): 145 mg/dL — ABNORMAL HIGH (ref 0–99)
Triglycerides: 180 mg/dL — ABNORMAL HIGH (ref 0–149)
VLDL Cholesterol Cal: 32 mg/dL (ref 5–40)

## 2024-03-15 LAB — HEMOGLOBIN A1C
Est. average glucose Bld gHb Est-mCnc: 105 mg/dL
Hgb A1c MFr Bld: 5.3 % (ref 4.8–5.6)

## 2024-03-15 LAB — TSH: TSH: 1.84 u[IU]/mL (ref 0.450–4.500)

## 2024-03-22 ENCOUNTER — Telehealth: Payer: Self-pay | Admitting: Urgent Care

## 2024-03-22 DIAGNOSIS — F321 Major depressive disorder, single episode, moderate: Secondary | ICD-10-CM

## 2024-03-22 MED ORDER — FLUOXETINE HCL 20 MG PO CAPS: 60.0000 mg | ORAL_CAPSULE | Freq: Every day | ORAL | 3 refills | Status: AC

## 2024-03-22 NOTE — Telephone Encounter (Signed)
 Received notification that fluoxetine  tablets are not covered by pharmacy but capsules are. Will call in alternative.

## 2024-03-22 NOTE — Telephone Encounter (Signed)
-----   Message from [facility] Christal N sent at 03/22/2024  2:10 PM EST ----- Spoke with patient, she stated that she would be fine with a capsule but needs it today. ----- Message ----- From: Lowella Benton CROME, PA Sent: 03/22/2024  11:56 AM EST To: Chiquita CROME Borer, CMA  Could you call pt and see if she got her medication for fluoxetine ? I refilled what she was already on, but [facility] states [facility] won't [facility] it... looks like I can change to a capsule if she would prefer? ----- Message ----- From: Roxanna Donzell BROCKS Sent: 03/22/2024   8:55 AM EST To: Benton CROME Lowella, PA

## 2024-03-28 ENCOUNTER — Other Ambulatory Visit (HOSPITAL_COMMUNITY): Payer: Self-pay

## 2024-04-08 DIAGNOSIS — M4721 Other spondylosis with radiculopathy, occipito-atlanto-axial region: Secondary | ICD-10-CM | POA: Diagnosis not present

## 2024-04-08 DIAGNOSIS — M4724 Other spondylosis with radiculopathy, thoracic region: Secondary | ICD-10-CM | POA: Diagnosis not present

## 2024-04-08 DIAGNOSIS — M5432 Sciatica, left side: Secondary | ICD-10-CM | POA: Diagnosis not present

## 2024-04-08 DIAGNOSIS — M5442 Lumbago with sciatica, left side: Secondary | ICD-10-CM | POA: Diagnosis not present

## 2024-04-09 DIAGNOSIS — M4721 Other spondylosis with radiculopathy, occipito-atlanto-axial region: Secondary | ICD-10-CM | POA: Diagnosis not present

## 2024-04-09 DIAGNOSIS — M5432 Sciatica, left side: Secondary | ICD-10-CM | POA: Diagnosis not present

## 2024-04-09 DIAGNOSIS — M4724 Other spondylosis with radiculopathy, thoracic region: Secondary | ICD-10-CM | POA: Diagnosis not present

## 2024-04-09 DIAGNOSIS — M5442 Lumbago with sciatica, left side: Secondary | ICD-10-CM | POA: Diagnosis not present

## 2024-05-02 ENCOUNTER — Ambulatory Visit

## 2024-05-02 DIAGNOSIS — Z1231 Encounter for screening mammogram for malignant neoplasm of breast: Secondary | ICD-10-CM | POA: Diagnosis not present
